# Patient Record
Sex: Female | Born: 1970 | Race: Black or African American | Hispanic: No | Marital: Married | Smoking: Never smoker
Health system: Southern US, Community
[De-identification: ages and names within clinical notes are randomized; demographics above are authoritative.]

## PROBLEM LIST (undated history)

## (undated) DIAGNOSIS — K219 Gastro-esophageal reflux disease without esophagitis: Secondary | ICD-10-CM

## (undated) DIAGNOSIS — I10 Essential (primary) hypertension: Secondary | ICD-10-CM

## (undated) DIAGNOSIS — B019 Varicella without complication: Secondary | ICD-10-CM

## (undated) HISTORY — DX: Varicella without complication: B01.9

## (undated) HISTORY — PX: CHOLECYSTECTOMY: SHX55

## (undated) HISTORY — DX: Essential (primary) hypertension: I10

## (undated) HISTORY — DX: Gastro-esophageal reflux disease without esophagitis: K21.9

---

## 1992-08-20 HISTORY — PX: TUBAL LIGATION: SHX77

## 2014-03-09 ENCOUNTER — Encounter (INDEPENDENT_AMBULATORY_CARE_PROVIDER_SITE_OTHER): Payer: Self-pay

## 2014-03-09 ENCOUNTER — Ambulatory Visit (INDEPENDENT_AMBULATORY_CARE_PROVIDER_SITE_OTHER): Payer: BC Managed Care – PPO | Admitting: Family Medicine

## 2014-03-09 ENCOUNTER — Encounter: Payer: Self-pay | Admitting: Family Medicine

## 2014-03-09 VITALS — BP 122/72 | HR 71 | Temp 97.7°F | Ht 63.25 in | Wt 254.8 lb

## 2014-03-09 DIAGNOSIS — D219 Benign neoplasm of connective and other soft tissue, unspecified: Secondary | ICD-10-CM | POA: Insufficient documentation

## 2014-03-09 DIAGNOSIS — A6000 Herpesviral infection of urogenital system, unspecified: Secondary | ICD-10-CM | POA: Insufficient documentation

## 2014-03-09 DIAGNOSIS — Z136 Encounter for screening for cardiovascular disorders: Secondary | ICD-10-CM

## 2014-03-09 DIAGNOSIS — I10 Essential (primary) hypertension: Secondary | ICD-10-CM | POA: Insufficient documentation

## 2014-03-09 DIAGNOSIS — Z1231 Encounter for screening mammogram for malignant neoplasm of breast: Secondary | ICD-10-CM

## 2014-03-09 DIAGNOSIS — D259 Leiomyoma of uterus, unspecified: Secondary | ICD-10-CM

## 2014-03-09 DIAGNOSIS — D500 Iron deficiency anemia secondary to blood loss (chronic): Secondary | ICD-10-CM | POA: Insufficient documentation

## 2014-03-09 LAB — COMPREHENSIVE METABOLIC PANEL
ALT: 10 U/L (ref 0–35)
AST: 16 U/L (ref 0–37)
Albumin: 3.6 g/dL (ref 3.5–5.2)
Alkaline Phosphatase: 50 U/L (ref 39–117)
BUN: 10 mg/dL (ref 6–23)
CO2: 25 meq/L (ref 19–32)
Calcium: 9.3 mg/dL (ref 8.4–10.5)
Chloride: 105 mEq/L (ref 96–112)
Creatinine, Ser: 0.6 mg/dL (ref 0.4–1.2)
GFR: 129.67 mL/min (ref >60.00)
GLUCOSE: 94 mg/dL (ref 70–99)
Potassium: 4.3 mEq/L (ref 3.5–5.1)
Sodium: 137 mEq/L (ref 135–145)
TOTAL PROTEIN: 7.3 g/dL (ref 6.0–8.3)
Total Bilirubin: 0.4 mg/dL (ref 0.2–1.2)

## 2014-03-09 LAB — CBC WITH DIFFERENTIAL/PLATELET
BASOS PCT: 0.4 % (ref 0.0–3.0)
Basophils Absolute: 0 10*3/uL (ref 0.0–0.1)
EOS ABS: 0.6 10*3/uL (ref 0.0–0.7)
EOS PCT: 8.8 % — AB (ref 0.0–5.0)
HCT: 31.5 % — ABNORMAL LOW (ref 36.0–46.0)
HEMOGLOBIN: 9.9 g/dL — AB (ref 12.0–15.0)
LYMPHS ABS: 1.5 10*3/uL (ref 0.7–4.0)
Lymphocytes Relative: 23 % (ref 12.0–46.0)
MCHC: 31.6 g/dL (ref 30.0–36.0)
MCV: 83.8 fl (ref 78.0–100.0)
Monocytes Absolute: 0.4 10*3/uL (ref 0.1–1.0)
Monocytes Relative: 6.4 % (ref 3.0–12.0)
Neutro Abs: 4 10*3/uL (ref 1.4–7.7)
Neutrophils Relative %: 61.4 % (ref 43.0–77.0)
Platelets: 462 10*3/uL — ABNORMAL HIGH (ref 150.0–400.0)
RBC: 3.75 Mil/uL — ABNORMAL LOW (ref 3.87–5.11)
RDW: 15.5 % (ref 11.5–15.5)
WBC: 6.5 10*3/uL (ref 4.0–10.5)

## 2014-03-09 LAB — LIPID PANEL
CHOLESTEROL: 145 mg/dL (ref 0–200)
HDL: 39.8 mg/dL (ref >39.00)
LDL Cholesterol: 93 mg/dL (ref 0–99)
NonHDL: 105.2
TRIGLYCERIDES: 63 mg/dL (ref 0.0–149.0)
Total CHOL/HDL Ratio: 4
VLDL: 12.6 mg/dL (ref 0.0–40.0)

## 2014-03-09 LAB — FERRITIN: Ferritin: 12 ng/mL (ref 10.0–291.0)

## 2014-03-09 NOTE — Assessment & Plan Note (Signed)
Well controlled without rx. Encouraged her to continue walking and lifestyle modification.

## 2014-03-09 NOTE — Assessment & Plan Note (Signed)
Continue iron. Check labs today. Orders Placed This Encounter  Procedures  . MM Digital Screening  . CBC with Differential  . Ferritin  . Comprehensive metabolic panel  . Lipid panel  . Ambulatory referral to Gynecology

## 2014-03-09 NOTE — Progress Notes (Signed)
Pre visit review using our clinic review tool, if applicable. No additional management support is needed unless otherwise documented below in the visit note. 

## 2014-03-09 NOTE — Assessment & Plan Note (Signed)
Has not had an outbreak in over 10 years. Prn Valtrex.

## 2014-03-09 NOTE — Progress Notes (Signed)
   Subjective:   Patient ID: Bailey Harmon, female    DOB: 1971/08/02, 43 y.o.   MRN: [id number]  Bailey Harmon is a pleasant 43 y.o. year old female who presents to clinic today with [place]  on 03/09/2014  HPI: HTN- has been diet controlled. Walking 4-5 times a day. Now well controlled off rx.  Fibroids- failed Lupron.  Has been followed by a clinical trial at [facility] and feels much better on current rx they have her on, including Lysteda.  She is not interested in having a hysterectomy if she can avoid this option.  Iron deficiency anemia- Due to fibroid bleeding.  Bleeding has improved. Feels much better on iron and other supplements.  Due for mammogram.  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   Takes Valtrex prn. No current outpatient prescriptions on file prior to visit.   No current facility-administered medications on file prior to visit.    No Known Allergies  Past Medical History  Diagnosis Date  . Chicken pox   . GERD (gastroesophageal reflux disease)   . Hypertension     Past Surgical History  Procedure Laterality Date  . Cholecystectomy    . Tubal ligation  1994    Family History  Problem Relation Age of Onset  . Hypertension Mother   . Cancer Father   . Heart disease Maternal Grandmother   . Hypertension Maternal Grandmother   . Diabetes Maternal Grandmother     History   Social History  . Marital Status: Married    Spouse Name: N/A    Number of Children: N/A  . Years of Education: N/A   Occupational History  . Not on file.   Social History Main Topics  . Smoking status: Never Smoker   . Smokeless tobacco: Never Used  . Alcohol Use: No  . Drug Use: No  . Sexual Activity: Yes   Other Topics Concern  . Not on file   Social History Narrative  . No narrative on file   The PMH, PSH, Social History, Family History, Medications, and allergies have been reviewed in [facility], and have been updated if relevant.   Review of  Systems    See HPI NO CP or SOB No dizziness when standing Improved fatigue  Objective:    BP 122/72  Pulse 71  Temp(Src) 97.7 F (36.5 C) (Oral)  Ht 5' 3.25" (1.607 m)  Wt 254 lb 12 oz (115.554 kg)  BMI 44.75 kg/m2  SpO2 99%  LMP 03/04/2014   Physical Exam  Nursing note and vitals reviewed. Constitutional: She appears well-developed and well-nourished. No distress.  HENT:  Head: Normocephalic.  Eyes: Pupils are equal, round, and reactive to light.  Neck: Normal range of motion. Neck supple.  Cardiovascular: Normal rate and regular rhythm.   Pulmonary/Chest: Effort normal and breath sounds normal.  Skin: Skin is warm and dry.  Psychiatric: She has a normal mood and affect. Her behavior is normal. Judgment and thought content normal.          Assessment & Plan:   Uterine leiomyoma, unspecified location - Plan: Ambulatory referral to Gynecology  Anemia due to chronic blood loss - Plan: CBC with Differential, Ferritin  Screening for ischemic heart disease - Plan: Comprehensive metabolic panel, Lipid panel No Follow-up on file.

## 2014-03-09 NOTE — Assessment & Plan Note (Signed)
Refer to GYN to consider myomectomy as she does remain symptomatic. The patient indicates understanding of these issues and agrees with the plan.

## 2014-03-09 NOTE — Patient Instructions (Addendum)
It was great to meet you. Please go to front desk and let them know you need to set up a referral or they will call if you if this is not an urgent referral.  Either MARION or LINDA will help you set it up.     We will call you with your lab results and you view them online.

## 2014-03-10 ENCOUNTER — Telehealth: Payer: Self-pay | Admitting: Family Medicine

## 2014-03-10 NOTE — Telephone Encounter (Signed)
Relevant patient education assigned to patient using Emmi. ° °

## 2014-04-14 ENCOUNTER — Ambulatory Visit (INDEPENDENT_AMBULATORY_CARE_PROVIDER_SITE_OTHER): Payer: BC Managed Care – PPO | Admitting: Obstetrics & Gynecology

## 2014-04-14 ENCOUNTER — Encounter: Payer: Self-pay | Admitting: Obstetrics & Gynecology

## 2014-04-14 VITALS — BP 147/98 | HR 74 | Ht 64.0 in | Wt 256.6 lb

## 2014-04-14 DIAGNOSIS — D251 Intramural leiomyoma of uterus: Secondary | ICD-10-CM | POA: Diagnosis not present

## 2014-04-14 NOTE — Patient Instructions (Addendum)
Abdominal Hysterectomy Abdominal hysterectomy is a surgical procedure to remove your womb (uterus). Your uterus is the muscular organ that contains a developing baby. This surgery is done for many reasons. You may need an abdominal hysterectomy if you have cancer, growths (tumors), long-term pain, or bleeding. You may also have this procedure if your uterus has slipped down into your vagina (uterine prolapse). Depending on why you need an abdominal hysterectomy, you may also have other reproductive organs removed. These could include the part of your vagina that connects with your uterus (cervix), the organs that make eggs (ovaries), and the tubes that connect the ovaries to the uterus (fallopian tubes). LET [facility] CARE PROVIDER KNOW ABOUT:   Any allergies you have.  All medicines you are taking, including vitamins, herbs, eye drops, creams, and over-the-counter medicines.  Previous problems you or members of your family have had with the use of anesthetics.  Any blood disorders you have.  Previous surgeries you have had.  Medical conditions you have. RISKS AND COMPLICATIONS Generally, this is a safe procedure. However, as with any procedure, problems can occur. Infection is the most common problem after an abdominal hysterectomy. Other possible problems include:  Bleeding.  Formation of blood clots that may break free and travel to your lungs.  Injury to other organs near your uterus.  Nerve injury causing nerve pain.  Decreased interest in sex or pain during sexual intercourse. BEFORE THE PROCEDURE  Abdominal hysterectomy is a major surgical procedure. It can affect the way you feel about yourself. Talk to your health care provider about the physical and emotional changes hysterectomy may cause.  You may need to have blood work and X-rays done before surgery.  Quit smoking if you smoke. Ask your health care provider for help if you are struggling to quit.  Stop taking  medicines that thin your blood as directed by your health care provider.  You may be instructed to take antibiotic medicines or laxatives before surgery.  Do not eat or drink anything for 6-8 hours before surgery.  Take your regular medicines with a small sip of water.  Bathe or shower the night or morning before surgery. PROCEDURE  Abdominal hysterectomy is done in the operating room at the hospital.  In most cases, you will be given a medicine that makes you go to sleep (general anesthetic).  The surgeon will make a cut (incision) through the skin in your lower belly.  The incision may be about 5-7 inches long. It may go side-to-side or up-and-down.  The surgeon will move aside the body tissue that covers your uterus. The surgeon will then carefully take out your uterus along with any of your other reproductive organs that need to be removed.  Bleeding will be controlled with clamps or sutures.  The surgeon will close your incision with sutures or metal clips. AFTER THE PROCEDURE  You will have some pain immediately after the procedure.  You will be given pain medicine in the recovery room.  You will be taken to your hospital room when you have recovered from the anesthesia.  You may need to stay in the hospital for 2-5 days.  You will be given instructions for recovery at home. Document Released: 08/11/2013 Document Reviewed: 08/11/2013 [facility] Patient Information 2015 [place], [place]. This information is not intended to replace advice given to you by your health care provider. Make sure you discuss any questions you have with your health care provider. Abdominal Hysterectomy, Care After Refer to this  sheet in the next few weeks. These instructions provide you with information on caring for yourself after your procedure. Your health care provider may also give you more specific instructions. Your treatment has been planned according to current medical practices, but problems  sometimes occur. Call your health care provider if you have any problems or questions after your procedure.  WHAT TO EXPECT AFTER THE PROCEDURE After your procedure, it is typical to have the following:  Pain.  Feeling tired.  Poor appetite.  Less interest in sex. HOME CARE INSTRUCTIONS  It takes 4-6 weeks to recover from this surgery. Make sure you follow all your health care provider's instructions. Home care instructions may include:  Take pain medicines only as directed by your health care provider. Do not take over-the-counter pain medicines without checking with your health care provider first.  Change your bandage as directed by your health care provider.  Return to your health care provider to have your sutures taken out.  Take showers instead of baths for 2-3 weeks. Ask your health care provider when it is safe to start showering.  Do not douche, use tampons, or have sexual intercourse for at least 6 weeks or until your health care provider says you can.   Follow your health care provider's advice about exercise, lifting, driving, and general activities.  Get plenty of rest and sleep.   Do not lift anything heavier than a gallon of milk (about 10 lb [4.5 kg]) for the first month after surgery.  You can resume your normal diet if your health care provider says it is okay.   Do not drink alcohol until your health care provider says you can.   If you are constipated, ask your health care provider if you can take a mild laxative.  Eating foods high in fiber may also help with constipation. Eat plenty of raw fruits and vegetables, whole grains, and beans.  Drink enough fluids to keep your urine clear or pale yellow.   Try to have someone at home with you for the first 1-2 weeks to help around the house.  Keep all follow-up appointments. SEEK MEDICAL CARE IF:   You have chills or fever.  You have swelling, redness, or pain in the area of your incision that is  getting worse.   You have pus coming from the incision.   You notice a bad smell coming from the incision or bandage.   Your incision breaks open.   You feel dizzy or light-headed.   You have pain or bleeding when you urinate.   You have persistent diarrhea.   You have persistent nausea and vomiting.   You have abnormal vaginal discharge.   You have a rash.   You have any type of abnormal reaction or develop an allergy to your medicine.   Your pain medicine is not helping.  SEEK IMMEDIATE MEDICAL CARE IF:   You have a fever and your symptoms suddenly get worse.  You have severe abdominal pain.  You have chest pain.  You have shortness of breath.  You faint.  You have pain, swelling, or redness of your leg.  You have heavy vaginal bleeding with blood clots. MAKE SURE YOU:  Understand these instructions.  Will watch your condition.  Will get help right away if you are not doing well or get worse. Document Released: 02/23/2005 Document Revised: 08/11/2013 Document Reviewed: 05/29/2013 [facility] Patient Information 2015 [place], [place]. This information is not intended to replace advice given to you  by your health care provider. Make sure you discuss any questions you have with your health care provider.

## 2014-04-14 NOTE — Progress Notes (Signed)
Subjective:     Patient ID: Bailey Harmon, female   DOB: Oct 05, 1970, 44 y.o.   MRN: [id number]  HPI 43 yo [id number]. Menses q 28 days lasting 5 days but, very heavy on days 1-3 1Pt was diagnosed with fibroids in 2005. Was recently part of an experimental drug to shrink fibroids.  She does not know the name of the med she was on.  Pt reports pain and heavy bleeding with her cycle.  She denies pain between her cycle. She does report freq urination due to fibroid pressure.   Pt reports that she has completed childbearing.  Last PAP 03/2013.  Past Medical History  Diagnosis Date  . Chicken pox   . GERD (gastroesophageal reflux disease)   . Hypertension    Past Surgical History  Procedure Laterality Date  . Cholecystectomy    . Tubal ligation  1994  c-section x 1  Current Outpatient Prescriptions on File Prior to Visit  Medication Sig Dispense Refill  . calcium-vitamin D (OSCAL WITH D) 500-200 MG-UNIT per tablet Take 1 tablet by mouth.      . cholecalciferol (VITAMIN D) 400 UNITS TABS tablet Take 400 Units by mouth.      . ferrous gluconate (FERGON) 324 MG tablet Take 324 mg by mouth daily with breakfast.      . GARCINIA CAMBOGIA-CHROMIUM PO Take by mouth. Take 4 tablets twice daily      . tranexamic acid (LYSTEDA) 650 MG TABS tablet Take 1,300 mg by mouth 3 (three) times daily.      . valACYclovir (VALTREX) 500 MG tablet Take 500 mg by mouth daily as needed.       No current facility-administered medications on file prior to visit.   No Known Allergies History   Social History  . Marital Status: Married    Spouse Name: N/A    Number of Children: N/A  . Years of Education: N/A   Occupational History  . Not on file.   Social History Main Topics  . Smoking status: Never Smoker   . Smokeless tobacco: Never Used  . Alcohol Use: No  . Drug Use: No  . Sexual Activity: Yes    Partners: Male    Birth Control/ Protection: None   Other Topics Concern  . Not on file   Social History  Narrative  . No narrative on file       Review of Systems     Objective:   Physical Exam BP 147/98  Pulse 74  Ht 5\' 4"  (1.626 m)  Wt 256 lb 9.6 oz (116.393 kg)  BMI 44.02 kg/m2  LMP 04/01/2014 Pt in NAD Abd; obese, NT, ND GU: EGBUS: no lesions Vagina: no blood in vault Cervix: no lesion; no mucopurulent d/c Uterus: enlarged.  >24weeks sized with broad base Adnexa: unable to assess due to body habitus  CBC    Component Value Date/Time   WBC 6.5 03/09/2014 1133   RBC 3.75* 03/09/2014 1133   HGB 9.9* 03/09/2014 1133   HCT 31.5* 03/09/2014 1133   PLT 462.0* 03/09/2014 1133   MCV 83.8 03/09/2014 1133   MCHC 31.6 03/09/2014 1133   RDW 15.5 03/09/2014 1133   LYMPHSABS 1.5 03/09/2014 1133   MONOABS 0.4 03/09/2014 1133   EOSABS 0.6 03/09/2014 1133   BASOSABS 0.0 03/09/2014 1133             Assessment:     Fibroids- pt wants treatment and desires a myomectomy. I have explained to pt that I  would not perform a myomectomy due to risk of complications in a pt that does not desire future fertility.  Reviewed the risks and benefits of a hyst vs myomectomy.  Pt invited to get a second opinion if she desires a myomectomy but, told that I would do her hyst if desired. I also discussed [place] with her.  She will discuss it with her husband and call back if she desires treatment.    Plan:     Need records from Dr. Jeanella Anton in [place] [facility]   Patient desires information on surgical management with TAH with bilateral salpingectomy with preservation of the ovaries..  The risks of surgery were discussed in detail with the patient including but not limited to: bleeding which may require transfusion or reoperation; infection which may require prolonged hospitalization or re-hospitalization and antibiotic therapy; injury to bowel, bladder, ureters and major vessels or other surrounding organs; need for additional procedures including laparotomy; thromboembolic phenomenon, incisional  problems and other postoperative or anesthesia complications.  Patient was told that the likelihood that her condition and symptoms will be treated effectively with this surgical management was very high; the postoperative expectations were also discussed in detail. The patient also understands the alternative treatment options which were discussed in full. All questions were answered. She reports that she will call after conversation with her husband.

## 2014-07-05 ENCOUNTER — Ambulatory Visit (INDEPENDENT_AMBULATORY_CARE_PROVIDER_SITE_OTHER): Payer: BC Managed Care – PPO | Admitting: Family Medicine

## 2014-07-05 ENCOUNTER — Encounter: Payer: Self-pay | Admitting: Family Medicine

## 2014-07-05 VITALS — BP 128/76 | HR 76 | Temp 98.0°F | Wt 264.8 lb

## 2014-07-05 DIAGNOSIS — N912 Amenorrhea, unspecified: Secondary | ICD-10-CM | POA: Insufficient documentation

## 2014-07-05 DIAGNOSIS — D259 Leiomyoma of uterus, unspecified: Secondary | ICD-10-CM

## 2014-07-05 LAB — POCT URINE PREGNANCY: Preg Test, Ur: NEGATIVE

## 2014-07-05 NOTE — Progress Notes (Signed)
   Subjective:   Patient ID: Bailey Harmon, female    DOB: 01-25-1971, 43 y.o.   MRN: [id number]  Bailey Harmon is a pleasant 43 y.o. year old female who presents to clinic today with Amenorrhea  on 07/05/2014  HPI: LMP 04/25/14- periods have always been regular. Has never skipped a period in past. H/o fibroids- saw Dr. Ihor Dow on 04/14/14- note reviewed- she suggested hysterectomy but Heninger wanted to wait it out first.  Not having any breast tenderness, abdominal pain, hot flashes or night sweats.  Current Outpatient Prescriptions on File Prior to Visit  Medication Sig Dispense Refill  . calcium-vitamin D (OSCAL WITH D) 500-200 MG-UNIT per tablet Take 1 tablet by mouth.    . cholecalciferol (VITAMIN D) 400 UNITS TABS tablet Take 400 Units by mouth.    . ferrous gluconate (FERGON) 324 MG tablet Take 324 mg by mouth daily with breakfast.    . GARCINIA CAMBOGIA-CHROMIUM PO Take by mouth. Take 4 tablets twice daily    . tranexamic acid (LYSTEDA) 650 MG TABS tablet Take 1,300 mg by mouth 3 (three) times daily.    . valACYclovir (VALTREX) 500 MG tablet Take 500 mg by mouth daily as needed.     No current facility-administered medications on file prior to visit.    No Known Allergies  Past Medical History  Diagnosis Date  . Chicken pox   . GERD (gastroesophageal reflux disease)   . Hypertension     Past Surgical History  Procedure Laterality Date  . Cholecystectomy    . Tubal ligation  1994    Family History  Problem Relation Age of Onset  . Hypertension Mother   . Cancer Father   . Heart disease Maternal Grandmother   . Hypertension Maternal Grandmother   . Diabetes Maternal Grandmother     History   Social History  . Marital Status: Married    Spouse Name: N/A    Number of Children: N/A  . Years of Education: N/A   Occupational History  . Not on file.   Social History Main Topics  . Smoking status: Never Smoker   . Smokeless tobacco: Never Used  .  Alcohol Use: No  . Drug Use: No  . Sexual Activity:    Partners: Male    Birth Control/ Protection: None   Other Topics Concern  . Not on file   Social History Narrative   The PMH, PSH, Social History, Family History, Medications, and allergies have been reviewed in [facility], and have been updated if relevant.   Review of Systems  Constitutional: Negative for fatigue.  Genitourinary: Positive for menstrual problem. Negative for vaginal bleeding, vaginal pain and pelvic pain.  Psychiatric/Behavioral: Negative for behavioral problems, sleep disturbance, dysphoric mood and agitation. The patient is not nervous/anxious.   All other systems reviewed and are negative.      Objective:    BP 128/76 mmHg  Pulse 76  Temp(Src) 98 F (36.7 C) (Oral)  Wt 264 lb 12 oz (120.09 kg)  SpO2 93%  LMP 04/25/2014   Physical Exam        Assessment & Plan:   Amenorrhea - Plan: POCT urine pregnancy  Uterine leiomyoma, unspecified location No Follow-up on file.

## 2014-07-05 NOTE — Assessment & Plan Note (Signed)
New- neg U preg. ?perimenopausal. Advised to keep symptoms journal.  Update me in a month or two. The patient indicates understanding of these issues and agrees with the plan.

## 2014-07-05 NOTE — Progress Notes (Signed)
Pre visit review using our clinic review tool, if applicable. No additional management support is needed unless otherwise documented below in the visit note. 

## 2014-07-05 NOTE — Patient Instructions (Signed)
Great to see you. You are not pregnant. Please update me next month.

## 2014-10-14 ENCOUNTER — Ambulatory Visit: Payer: Self-pay | Admitting: Bariatrics

## 2014-11-02 ENCOUNTER — Other Ambulatory Visit: Payer: Self-pay

## 2014-11-02 NOTE — Telephone Encounter (Signed)
Pt left note requesting refill valtrex to [facility] [address]. pts note said previously discussed with Dr Deborra Medina about filling rx when needed. Pt established care 03/09/14 and had sick visit on 07/05/14.no future appt scheduled.Please advise.

## 2014-11-03 MED ORDER — VALACYCLOVIR HCL 500 MG PO TABS: 500.0000 mg | ORAL_TABLET | Freq: Every day | ORAL | Status: DC | PRN

## 2015-02-10 ENCOUNTER — Other Ambulatory Visit: Payer: Self-pay

## 2015-02-10 MED ORDER — TRANEXAMIC ACID 650 MG PO TABS: 1300.0000 mg | ORAL_TABLET | Freq: Three times a day (TID) | ORAL | Status: DC

## 2015-02-10 NOTE — Telephone Encounter (Signed)
Pt left v/m requesting refill lysteda to [facility] [address] Dr Deborra Medina has not prescribed before; pt last seen 07/05/14.

## 2015-04-14 ENCOUNTER — Other Ambulatory Visit (HOSPITAL_COMMUNITY)
Admission: RE | Admit: 2015-04-14 | Discharge: 2015-04-14 | Disposition: A | Discharge status: Home / Self Care | Payer: BC Managed Care – PPO | Source: Ambulatory Visit | Attending: Family Medicine | Admitting: Family Medicine

## 2015-04-14 ENCOUNTER — Ambulatory Visit (INDEPENDENT_AMBULATORY_CARE_PROVIDER_SITE_OTHER): Payer: BC Managed Care – PPO | Admitting: Family Medicine

## 2015-04-14 ENCOUNTER — Encounter: Payer: Self-pay | Admitting: Family Medicine

## 2015-04-14 VITALS — BP 124/78 | HR 76 | Temp 98.0°F | Ht 64.0 in | Wt 236.2 lb

## 2015-04-14 DIAGNOSIS — Z01419 Encounter for gynecological examination (general) (routine) without abnormal findings: Secondary | ICD-10-CM

## 2015-04-14 DIAGNOSIS — E669 Obesity, unspecified: Secondary | ICD-10-CM | POA: Diagnosis not present

## 2015-04-14 DIAGNOSIS — Z113 Encounter for screening for infections with a predominantly sexual mode of transmission: Secondary | ICD-10-CM | POA: Diagnosis present

## 2015-04-14 DIAGNOSIS — D250 Submucous leiomyoma of uterus: Secondary | ICD-10-CM

## 2015-04-14 DIAGNOSIS — Z1151 Encounter for screening for human papillomavirus (HPV): Secondary | ICD-10-CM | POA: Insufficient documentation

## 2015-04-14 DIAGNOSIS — N760 Acute vaginitis: Secondary | ICD-10-CM | POA: Insufficient documentation

## 2015-04-14 DIAGNOSIS — Z1239 Encounter for other screening for malignant neoplasm of breast: Secondary | ICD-10-CM

## 2015-04-14 DIAGNOSIS — Z0000 Encounter for general adult medical examination without abnormal findings: Secondary | ICD-10-CM

## 2015-04-14 LAB — COMPREHENSIVE METABOLIC PANEL
ALT: 11 U/L (ref 0–35)
AST: 17 U/L (ref 0–37)
Albumin: 3.8 g/dL (ref 3.5–5.2)
Alkaline Phosphatase: 50 U/L (ref 39–117)
BILIRUBIN TOTAL: 0.3 mg/dL (ref 0.2–1.2)
BUN: 11 mg/dL (ref 6–23)
CALCIUM: 9.1 mg/dL (ref 8.4–10.5)
CO2: 24 mEq/L (ref 19–32)
Chloride: 104 mEq/L (ref 96–112)
Creatinine, Ser: 0.62 mg/dL (ref 0.40–1.20)
GFR: 133.84 mL/min (ref >60.00)
GLUCOSE: 84 mg/dL (ref 70–99)
Potassium: 4.4 mEq/L (ref 3.5–5.1)
Sodium: 134 mEq/L — ABNORMAL LOW (ref 135–145)
Total Protein: 7.5 g/dL (ref 6.0–8.3)

## 2015-04-14 LAB — CBC WITH DIFFERENTIAL/PLATELET
BASOS ABS: 0 10*3/uL (ref 0.0–0.1)
Basophils Relative: 0.5 % (ref 0.0–3.0)
EOS ABS: 0.1 10*3/uL (ref 0.0–0.7)
Eosinophils Relative: 1.9 % (ref 0.0–5.0)
Hemoglobin: 6.9 g/dL — CL (ref 12.0–15.0)
LYMPHS PCT: 21.8 % (ref 12.0–46.0)
Lymphs Abs: 1.4 10*3/uL (ref 0.7–4.0)
MCHC: 28.3 g/dL — ABNORMAL LOW (ref 30.0–36.0)
MCV: 62.6 fl — ABNORMAL LOW (ref 78.0–100.0)
MONO ABS: 0.5 10*3/uL (ref 0.1–1.0)
Monocytes Relative: 7.8 % (ref 3.0–12.0)
NEUTROS ABS: 4.3 10*3/uL (ref 1.4–7.7)
Neutrophils Relative %: 68 % (ref 43.0–77.0)
Platelets: 307 10*3/uL (ref 150.0–400.0)
RBC: 3.86 Mil/uL — ABNORMAL LOW (ref 3.87–5.11)
RDW: 19.9 % — ABNORMAL HIGH (ref 11.5–15.5)
WBC: 6.4 10*3/uL (ref 4.0–10.5)

## 2015-04-14 LAB — LIPID PANEL
CHOL/HDL RATIO: 3
Cholesterol: 144 mg/dL (ref 0–200)
HDL: 50 mg/dL (ref >39.00)
LDL Cholesterol: 85 mg/dL (ref 0–99)
NONHDL: 93.87
TRIGLYCERIDES: 46 mg/dL (ref 0.0–149.0)
VLDL: 9.2 mg/dL (ref 0.0–40.0)

## 2015-04-14 LAB — TSH: TSH: 2.11 u[IU]/mL (ref 0.35–4.50)

## 2015-04-14 NOTE — Addendum Note (Signed)
Addended by: Lucille Passy on: 04/14/2015 12:40 PM   Modules accepted: Miquel Dunn

## 2015-04-14 NOTE — Assessment & Plan Note (Signed)
Reviewed preventive care protocols, scheduled due services, and updated immunizations Discussed nutrition, exercise, diet, and healthy lifestyle.  Pap smear.  Labs today.  Mammogram ordered- pt to schedule.  Orders Placed This Encounter  Procedures  . MM Digital Screening  . CBC with Differential/Platelet  . Comprehensive metabolic panel  . Lipid panel  . TSH  . HIV antibody (with reflex)  . RPR

## 2015-04-14 NOTE — Progress Notes (Signed)
Pre visit review using our clinic review tool, if applicable. No additional management support is needed unless otherwise documented below in the visit note. 

## 2015-04-14 NOTE — Addendum Note (Signed)
Addended by: Modena Nunnery on: 04/14/2015 12:39 PM   Modules accepted: Orders

## 2015-04-14 NOTE — Patient Instructions (Signed)
Great to see you. Please schedule your mammogram.

## 2015-04-14 NOTE — Progress Notes (Addendum)
Subjective:   Patient ID: Bailey Harmon, female    DOB: 07/29/1971, 44 y.o.   MRN: [id number]  Bailey Harmon is a pleasant 44 y.o. year old female who presents to clinic today with Annual Exam  on 04/14/2015  HPI:  Doing well.  Has no complaints today. Menorrhagia has improved since she started working with a Physiological scientist and losing weight. Wt Readings from Last 3 Encounters:  04/14/15 236 lb 4 oz (107.162 kg)  07/05/14 264 lb 12 oz (120.09 kg)  04/14/14 256 lb 9.6 oz (116.393 kg)   Has not a mammogram in over 3 years.  Current Outpatient Prescriptions on File Prior to Visit  Medication Sig Dispense Refill  . calcium-vitamin D (OSCAL WITH D) 500-200 MG-UNIT per tablet Take 1 tablet by mouth.    . cholecalciferol (VITAMIN D) 400 UNITS TABS tablet Take 400 Units by mouth.    . ferrous gluconate (FERGON) 324 MG tablet Take 324 mg by mouth daily with breakfast.    . GARCINIA CAMBOGIA-CHROMIUM PO Take by mouth. Take 4 tablets twice daily    . tranexamic acid (LYSTEDA) 650 MG TABS tablet Take 2 tablets (1,300 mg total) by mouth 3 (three) times daily. 30 tablet 0  . valACYclovir (VALTREX) 500 MG tablet Take 1 tablet (500 mg total) by mouth daily as needed. 30 tablet 0   No current facility-administered medications on file prior to visit.    No Known Allergies  Past Medical History  Diagnosis Date  . Chicken pox   . GERD (gastroesophageal reflux disease)   . Hypertension     Past Surgical History  Procedure Laterality Date  . Cholecystectomy    . Tubal ligation  1994    Family History  Problem Relation Age of Onset  . Hypertension Mother   . Cancer Father   . Heart disease Maternal Grandmother   . Hypertension Maternal Grandmother   . Diabetes Maternal Grandmother     Social History   Social History  . Marital Status: Married    Spouse Name: N/A  . Number of Children: N/A  . Years of Education: N/A   Occupational History  . Not on file.   Social History  Main Topics  . Smoking status: Never Smoker   . Smokeless tobacco: Never Used  . Alcohol Use: No  . Drug Use: No  . Sexual Activity:    Partners: Male    Birth Control/ Protection: None   Other Topics Concern  . Not on file   Social History Narrative   .reivewed   Review of Systems  Constitutional: Negative.   HENT: Negative.   Respiratory: Negative.   Cardiovascular: Negative.   Gastrointestinal: Negative.   Endocrine: Negative.   Genitourinary: Negative.   Skin: Negative.   Allergic/Immunologic: Negative.   Neurological: Negative.   Hematological: Negative.   Psychiatric/Behavioral: Negative.   All other systems reviewed and are negative.      Objective:    BP 124/78 mmHg  Pulse 76  Temp(Src) 98 F (36.7 C) (Oral)  Ht 5\' 4"  (1.626 m)  Wt 236 lb 4 oz (107.162 kg)  BMI 40.53 kg/m2  SpO2 100%  LMP 03/21/2015 (Within Days)   Physical Exam   General:  Well-developed,well-nourished,in no acute distress; alert,appropriate and cooperative throughout examination Head:  normocephalic and atraumatic.   Eyes:  vision grossly intact, pupils equal, pupils round, and pupils reactive to light.   Ears:  R ear normal and L ear normal.   Nose:  no external deformity.   Mouth:  good dentition.   Neck:  No deformities, masses, or tenderness noted. Breasts:  No mass, nodules, thickening, tenderness, bulging, retraction, inflamation, nipple discharge or skin changes noted.   Lungs:  Normal respiratory effort, chest expands symmetrically. Lungs are clear to auscultation, no crackles or wheezes. Heart:  Normal rate and regular rhythm. S1 and S2 normal without gallop, murmur, click, rub or other extra sounds. Abdomen:  Bowel sounds positive,abdomen soft and non-tender without masses, organomegaly or hernias noted. Rectal:  no external abnormalities.   Genitalia:  Pelvic Exam:        External: normal female genitalia without lesions or masses        Vagina: normal without  lesions or masses        Cervix: normal without lesions or masses        Adnexa: unable to assess due to large fibroids        Uterus: enlarged, palpable fibroids        Pap smear: performed Msk:  No deformity or scoliosis noted of thoracic or lumbar spine.   Extremities:  No clubbing, cyanosis, edema, or deformity noted with normal full range of motion of all joints.   Neurologic:  alert & oriented X3 and gait normal.   Skin:  Intact without suspicious lesions or rashes Cervical Nodes:  No lymphadenopathy noted Axillary Nodes:  No palpable lymphadenopathy Psych:  Cognition and judgment appear intact. Alert and cooperative with normal attention span and concentration. No apparent delusions, illusions, hallucinations       Assessment & Plan:   Well woman exam with routine gynecological exam  Obesity  Submucous leiomyoma of uterus No Follow-up on file.

## 2015-04-14 NOTE — Assessment & Plan Note (Signed)
Congratulated on her continued success and encouraged her to continue with lifestyle changes.

## 2015-04-15 ENCOUNTER — Other Ambulatory Visit: Payer: Self-pay | Admitting: Family Medicine

## 2015-04-15 ENCOUNTER — Telehealth: Payer: Self-pay

## 2015-04-15 DIAGNOSIS — D62 Acute posthemorrhagic anemia: Secondary | ICD-10-CM

## 2015-04-15 LAB — HIV ANTIBODY (ROUTINE TESTING W REFLEX): HIV: NONREACTIVE

## 2015-04-15 LAB — RPR

## 2015-04-15 MED ORDER — FERROUS SULFATE 325 (65 FE) MG PO TABS: 325.0000 mg | ORAL_TABLET | Freq: Two times a day (BID) | ORAL | Status: DC

## 2015-04-15 NOTE — Telephone Encounter (Signed)
PLEASE NOTE: All timestamps contained within this report are represented as [place] Standard Time. CONFIDENTIALTY NOTICE: This fax transmission is intended only for the addressee. It contains information that is legally privileged, confidential or otherwise protected from use or disclosure. If you are not the intended recipient, you are strictly prohibited from reviewing, disclosing, copying using or disseminating any of this information or taking any action in reliance on or regarding this information. If you have received this fax in error, please notify us immediately by telephone so that we can arrange for its return to [place]. Phone: [phone], Toll-Free: [phone], Fax: [phone] Page: 1 of 2 Call Id: [id number] [facility] Patient Name: Bailey Harmon Gender: Female DOB: 26-Aug-1970 Age: 44 Y 62 D Return Phone Number: [phone] (Primary) Address: City/State/Zip: [place] - Client Client Site [place] Physician Deborra Medina, [place] Contact Type Call Call Type Triage / Clinical Caller Name Manuela Schwartz @ [facility] Relationship To Patient Provider Return Phone Number [phone] (Primary) Chief Complaint Lab Result (Critical) Initial Comment Manuela Schwartz - Lab @ [facility] @ [phone]- Critical Lab Nurse Assessment Nurse: Cox, RN, [facility] Date/Time Eilene Ghazi Time): 04/14/2015 5:35:35 PM Is there an on-call provider listed? ---Yes Please list name of person reporting value (Lab Employee) and a contact number. ---Manuela Schwartz [phone] Please document the following items: Lab name Lab value (read back to lab to verify) Reference range for lab value Date and time blood was drawn Collect time of birth for bilirubin results ---Hemoglobin 6.9, critical Hematocrit low- not critical 24.2 Drawn today at 1250 Ref range HgB 12-15  HcT 26-46 Specimen not hemolyzed Please collect the patient contact information from the lab. (name, phone number and address) ---Pearla Dubonnet personal-[phone] Guidelines Guideline Title Affirmed Question Affirmed Notes Nurse Date/Time ([place] Time) Disp. Time Eilene Ghazi Time) Disposition Final User 04/14/2015 5:42:36 PM Called On-Call Provider Cox, RN, [facility] 12/26/3265 1:24:58 PM Clinical Call Yes Cox, RN, [facility] After Care Instructions Given Call Event Type User Date / Time Description Paging DoctorName Phone DateTime Result/Outcome Message Type Notes Gwendolyn Grant [phone] 04/14/2015 5:42:36 PM Called On Call Provider - Reached Doctor Paged Gwendolyn Grant 04/14/2015 5:43:12 PM Spoke with On Call - General Message Result MD notified of lab value, no orders recieved PLEASE NOTE: All timestamps contained within this report are represented as [place] Standard Time. CONFIDENTIALTY NOTICE: This fax transmission is intended only for the addressee. It contains information that is legally privileged, confidential or otherwise protected from use or disclosure. If you are not the intended recipient, you are strictly prohibited from reviewing, disclosing, copying using or disseminating any of this information or taking any action in reliance on or regarding this information. If you have received this fax in error, please notify us immediately by telephone so that we can arrange for its return to [place]. Phone: [phone], Toll-Free: [phone], Fax: [phone] Page: 2 of 2 Call Id: [id number]

## 2015-04-15 NOTE — Telephone Encounter (Addendum)
Dr Deborra Medina out of office; will send note to Dr Deborra Medina and Dr Danise Mina for review.

## 2015-04-15 NOTE — Telephone Encounter (Signed)
See result note.  

## 2015-04-18 ENCOUNTER — Other Ambulatory Visit (INDEPENDENT_AMBULATORY_CARE_PROVIDER_SITE_OTHER): Payer: BC Managed Care – PPO

## 2015-04-18 DIAGNOSIS — D62 Acute posthemorrhagic anemia: Secondary | ICD-10-CM | POA: Diagnosis not present

## 2015-04-18 LAB — CBC WITH DIFFERENTIAL/PLATELET
BASOS ABS: 0.1 10*3/uL (ref 0.0–0.1)
Basophils Relative: 1 % (ref 0.0–3.0)
Eosinophils Absolute: 0.1 10*3/uL (ref 0.0–0.7)
Eosinophils Relative: 1.4 % (ref 0.0–5.0)
HCT: 24.6 % — ABNORMAL LOW (ref 36.0–46.0)
Hemoglobin: 6.9 g/dL — CL (ref 12.0–15.0)
LYMPHS ABS: 1.7 10*3/uL (ref 0.7–4.0)
Lymphocytes Relative: 22.6 % (ref 12.0–46.0)
MCHC: 28.2 g/dL — AB (ref 30.0–36.0)
MCV: 62.7 fl — AB (ref 78.0–100.0)
MONOS PCT: 5.7 % (ref 3.0–12.0)
Monocytes Absolute: 0.4 10*3/uL (ref 0.1–1.0)
NEUTROS ABS: 5.2 10*3/uL (ref 1.4–7.7)
NEUTROS PCT: 69.3 % (ref 43.0–77.0)
PLATELETS: 371 10*3/uL (ref 150.0–400.0)
RBC: 3.92 Mil/uL (ref 3.87–5.11)
RDW: 19.7 % — ABNORMAL HIGH (ref 11.5–15.5)
WBC: 7.6 10*3/uL (ref 4.0–10.5)

## 2015-04-18 LAB — CYTOLOGY - PAP

## 2015-04-19 ENCOUNTER — Other Ambulatory Visit: Payer: Self-pay | Admitting: Family Medicine

## 2015-04-19 ENCOUNTER — Encounter: Payer: Self-pay | Admitting: *Deleted

## 2015-04-19 DIAGNOSIS — D500 Iron deficiency anemia secondary to blood loss (chronic): Secondary | ICD-10-CM

## 2015-04-19 LAB — CERVICOVAGINAL ANCILLARY ONLY: Herpes: NEGATIVE

## 2015-04-19 MED ORDER — TRANEXAMIC ACID 650 MG PO TABS: 1300.0000 mg | ORAL_TABLET | Freq: Three times a day (TID) | ORAL | Status: DC

## 2015-04-19 NOTE — Addendum Note (Signed)
Addended by: Modena Nunnery on: 04/19/2015 01:57 PM   Modules accepted: Orders

## 2015-04-20 LAB — CERVICOVAGINAL ANCILLARY ONLY
BACTERIAL VAGINITIS: NEGATIVE
CANDIDA VAGINITIS: NEGATIVE

## 2015-04-26 ENCOUNTER — Telehealth: Payer: Self-pay | Admitting: *Deleted

## 2015-04-26 ENCOUNTER — Other Ambulatory Visit (INDEPENDENT_AMBULATORY_CARE_PROVIDER_SITE_OTHER): Payer: BC Managed Care – PPO

## 2015-04-26 ENCOUNTER — Observation Stay (HOSPITAL_COMMUNITY)
Admission: EM | Admit: 2015-04-26 | Discharge: 2015-04-28 | Disposition: A | Discharge status: Home / Self Care | Payer: BC Managed Care – PPO | Attending: Internal Medicine | Admitting: Internal Medicine

## 2015-04-26 ENCOUNTER — Encounter (HOSPITAL_COMMUNITY): Payer: Self-pay | Admitting: Emergency Medicine

## 2015-04-26 DIAGNOSIS — I10 Essential (primary) hypertension: Secondary | ICD-10-CM | POA: Diagnosis not present

## 2015-04-26 DIAGNOSIS — D500 Iron deficiency anemia secondary to blood loss (chronic): Secondary | ICD-10-CM | POA: Diagnosis not present

## 2015-04-26 DIAGNOSIS — D259 Leiomyoma of uterus, unspecified: Secondary | ICD-10-CM | POA: Diagnosis not present

## 2015-04-26 DIAGNOSIS — D62 Acute posthemorrhagic anemia: Principal | ICD-10-CM | POA: Insufficient documentation

## 2015-04-26 DIAGNOSIS — N920 Excessive and frequent menstruation with regular cycle: Secondary | ICD-10-CM | POA: Diagnosis not present

## 2015-04-26 DIAGNOSIS — Z9851 Tubal ligation status: Secondary | ICD-10-CM | POA: Insufficient documentation

## 2015-04-26 DIAGNOSIS — Z809 Family history of malignant neoplasm, unspecified: Secondary | ICD-10-CM | POA: Diagnosis not present

## 2015-04-26 DIAGNOSIS — Z8249 Family history of ischemic heart disease and other diseases of the circulatory system: Secondary | ICD-10-CM | POA: Diagnosis not present

## 2015-04-26 DIAGNOSIS — Z79899 Other long term (current) drug therapy: Secondary | ICD-10-CM | POA: Insufficient documentation

## 2015-04-26 DIAGNOSIS — D219 Benign neoplasm of connective and other soft tissue, unspecified: Secondary | ICD-10-CM | POA: Diagnosis present

## 2015-04-26 DIAGNOSIS — Z833 Family history of diabetes mellitus: Secondary | ICD-10-CM | POA: Insufficient documentation

## 2015-04-26 DIAGNOSIS — Z9049 Acquired absence of other specified parts of digestive tract: Secondary | ICD-10-CM | POA: Insufficient documentation

## 2015-04-26 DIAGNOSIS — K219 Gastro-esophageal reflux disease without esophagitis: Secondary | ICD-10-CM | POA: Insufficient documentation

## 2015-04-26 LAB — CBC WITH DIFFERENTIAL/PLATELET
BASOS ABS: 0.1 10*3/uL (ref 0.0–0.1)
BASOS PCT: 1 % (ref 0–1)
Band Neutrophils: 0 % (ref 0–10)
Basophils Absolute: 0.1 10*3/uL (ref 0.0–0.1)
Basophils Relative: 1.1 % (ref 0.0–3.0)
Blasts: 0 %
EOS ABS: 0.3 10*3/uL (ref 0.0–0.7)
EOS PCT: 4 % (ref 0–5)
Eosinophils Absolute: 0.2 10*3/uL (ref 0.0–0.7)
Eosinophils Relative: 2.3 % (ref 0.0–5.0)
HCT: 23.1 % — CL (ref 36.0–46.0)
HCT: 23.5 % — ABNORMAL LOW (ref 36.0–46.0)
Hemoglobin: 6.5 g/dL — CL (ref 12.0–15.0)
Hemoglobin: 6.3 g/dL — CL (ref 12.0–15.0)
LYMPHS ABS: 1.4 10*3/uL (ref 0.7–4.0)
LYMPHS ABS: 1.7 10*3/uL (ref 0.7–4.0)
LYMPHS PCT: 20 % (ref 12–46)
Lymphocytes Relative: 20 % (ref 12.0–46.0)
MCH: 17.2 pg — AB (ref 26.0–34.0)
MCHC: 27.8 g/dL — ABNORMAL LOW (ref 30.0–36.0)
MCHC: 26.8 g/dL — AB (ref 30.0–36.0)
MCV: 62.5 fl — ABNORMAL LOW (ref 78.0–100.0)
MCV: 64.2 fL — AB (ref 78.0–100.0)
MONO ABS: 0.4 10*3/uL (ref 0.1–1.0)
MONO ABS: 0.5 10*3/uL (ref 0.1–1.0)
MONOS PCT: 6 % (ref 3–12)
MYELOCYTES: 0 %
Metamyelocytes Relative: 0 %
Monocytes Relative: 6.1 % (ref 3.0–12.0)
NEUTROS ABS: 5 10*3/uL (ref 1.4–7.7)
NEUTROS ABS: 6.1 10*3/uL (ref 1.7–7.7)
NEUTROS PCT: 70.5 % (ref 43.0–77.0)
NEUTROS PCT: 69 % (ref 43–77)
NRBC: 0 /100{WBCs}
PLATELETS: 563 10*3/uL — AB (ref 150.0–400.0)
PLATELETS: 631 10*3/uL — AB (ref 150–400)
Promyelocytes Absolute: 0 %
RBC: 3.69 Mil/uL — ABNORMAL LOW (ref 3.87–5.11)
RBC: 3.66 MIL/uL — AB (ref 3.87–5.11)
RDW: 20.9 % — AB (ref 11.5–15.5)
RDW: 19.3 % — ABNORMAL HIGH (ref 11.5–15.5)
SMEAR REVIEW: INCREASED
WBC: 7.1 10*3/uL (ref 4.0–10.5)
WBC: 8.7 10*3/uL (ref 4.0–10.5)

## 2015-04-26 LAB — COMPREHENSIVE METABOLIC PANEL
ALT: 15 U/L (ref 14–54)
ANION GAP: 6 (ref 5–15)
AST: 21 U/L (ref 15–41)
Albumin: 3.2 g/dL — ABNORMAL LOW (ref 3.5–5.0)
Alkaline Phosphatase: 55 U/L (ref 38–126)
BUN: 10 mg/dL (ref 6–20)
CALCIUM: 8.8 mg/dL — AB (ref 8.9–10.3)
CHLORIDE: 105 mmol/L (ref 101–111)
CO2: 25 mmol/L (ref 22–32)
CREATININE: 0.74 mg/dL (ref 0.44–1.00)
Glucose, Bld: 101 mg/dL — ABNORMAL HIGH (ref 65–99)
Potassium: 4.2 mmol/L (ref 3.5–5.1)
SODIUM: 136 mmol/L (ref 135–145)
Total Bilirubin: 0.1 mg/dL — ABNORMAL LOW (ref 0.3–1.2)
Total Protein: 6.8 g/dL (ref 6.5–8.1)

## 2015-04-26 MED ORDER — SODIUM CHLORIDE 0.9 % IV SOLN
10.0000 mL/h | Freq: Once | INTRAVENOUS | Status: AC
  Administered 2015-04-26: 10 mL/h via INTRAVENOUS

## 2015-04-26 NOTE — ED Provider Notes (Addendum)
CSN: [id number]     Arrival date & time 04/26/15  1936 History   This chart was scribed for Bailey Fuel, MD by Forrestine Him, ED Scribe. This patient was seen in room D32C/D32C and the patient's care was started 11:09 PM.   Chief Complaint  Patient presents with  . Abnormal Lab    The patient said she went to the doctor for her check up and her MD said she had "low iron".     The history is provided by the patient. No language interpreter was used.    HPI Comments: Bailey Harmon is a 44 y.o. female with a PMHx of HTN who presents to the Emergency Department here for abnormal labs this evening. Pt went for her CPE recently in the last few weeks. At time of visit, routine labs performed showing low hemoglobin levels at 6.9. She was started on iron supplement pills 2 weeks ago which brought levels back to baseline. However, pt states levels dropped significantly low after her monthly menstrual period started. Pt was advised to come to the Emergency Department for a blood transfusion per her PCP. [name] denies any generalized weakness but admits to intermittent fatigue. No recent fever or chills. No known allergies to medications.  PCP: Arnette Norris, MD   Past Medical History  Diagnosis Date  . Chicken pox   . GERD (gastroesophageal reflux disease)   . Hypertension    Past Surgical History  Procedure Laterality Date  . Cholecystectomy    . Tubal ligation  1994   Family History  Problem Relation Age of Onset  . Hypertension Mother   . Cancer Father   . Heart disease Maternal Grandmother   . Hypertension Maternal Grandmother   . Diabetes Maternal Grandmother    Social History  Substance Use Topics  . Smoking status: Never Smoker   . Smokeless tobacco: Never Used  . Alcohol Use: No   OB History    No data available     Review of Systems  Constitutional: Positive for fatigue. Negative for fever and chills.  Respiratory: Negative for cough and shortness of breath.    Cardiovascular: Negative for chest pain.  Gastrointestinal: Negative for nausea, vomiting and abdominal pain.  Musculoskeletal: Negative for myalgias.  Neurological: Negative for dizziness, weakness, numbness and headaches.  Psychiatric/Behavioral: Negative for confusion.  All other systems reviewed and are negative.     Allergies  Review of patient's allergies indicates no known allergies.  Home Medications   Prior to Admission medications   Medication Sig Start Date End Date Taking? Authorizing Provider  calcium-vitamin D (OSCAL WITH D) 500-200 MG-UNIT per tablet Take 1 tablet by mouth.    Historical Provider, MD  cholecalciferol (VITAMIN D) 400 UNITS TABS tablet Take 400 Units by mouth.    Historical Provider, MD  ferrous gluconate (FERGON) 324 MG tablet Take 324 mg by mouth daily with breakfast.    Historical Provider, MD  ferrous sulfate 325 (65 FE) MG tablet Take 1 tablet (325 mg total) by mouth 2 (two) times daily with a meal. 04/15/15   Lucille Passy, MD  GARCINIA CAMBOGIA-CHROMIUM PO Take by mouth. Take 4 tablets twice daily    Historical Provider, MD  tranexamic acid (LYSTEDA) 650 MG TABS tablet Take 2 tablets (1,300 mg total) by mouth 3 (three) times daily. 04/19/15   Lucille Passy, MD  valACYclovir (VALTREX) 500 MG tablet Take 1 tablet (500 mg total) by mouth daily as needed. 11/03/14   Marciano Sequin  Deborra Medina, MD   Triage Vitals: BP 114/74 mmHg  Pulse 88  Temp(Src) 97.8 F (36.6 C) (Oral)  Resp 18  SpO2 100%  LMP 03/21/2015 (Within Days)   Physical Exam  Constitutional: She is oriented to person, place, and time. She appears well-developed and well-nourished.  HENT:  Head: Normocephalic and atraumatic.  Eyes: EOM are normal. Pupils are equal, round, and reactive to light.  Conjunctiva are pale   Neck: Normal range of motion. Neck supple. No JVD present.  Cardiovascular: Normal rate, regular rhythm and normal heart sounds.   No murmur heard. Pulmonary/Chest: Effort normal and  breath sounds normal. She has no wheezes. She has no rales. She exhibits no tenderness.  Abdominal: Soft. Bowel sounds are normal. She exhibits no distension and no mass. There is no tenderness.  Musculoskeletal: Normal range of motion. She exhibits no edema.  Lymphadenopathy:    She has no cervical adenopathy.  Neurological: She is alert and oriented to person, place, and time. No cranial nerve deficit. She exhibits normal muscle tone. Coordination normal.  Skin: Skin is warm and dry. No rash noted.  Psychiatric: She has a normal mood and affect. Her behavior is normal. Judgment and thought content normal.  Nursing note and vitals reviewed.   ED Course  Procedures (including critical care time)  DIAGNOSTIC STUDIES: Oxygen Saturation is 100% on RA, Normal by my interpretation.    COORDINATION OF CARE: 11:18 PM- Will order CBC and CMP. Will give fluids and arrange for admission for blood transfusion. Discussed treatment plan with pt at bedside and pt agreed to plan.     Labs Review Results for orders placed or performed during the hospital encounter of 04/26/15  CBC with Differential  Result Value Ref Range   WBC 8.7 4.0 - 10.5 K/uL   RBC 3.66 (L) 3.87 - 5.11 MIL/uL   Hemoglobin 6.3 (LL) 12.0 - 15.0 g/dL   HCT 23.5 (L) 36.0 - 46.0 %   MCV 64.2 (L) 78.0 - 100.0 fL   MCH 17.2 (L) 26.0 - 34.0 pg   MCHC 26.8 (L) 30.0 - 36.0 g/dL   RDW 19.3 (H) 11.5 - 15.5 %   Platelets 631 (H) 150 - 400 K/uL   Neutrophils Relative % 69 43 - 77 %   Lymphocytes Relative 20 12 - 46 %   Monocytes Relative 6 3 - 12 %   Eosinophils Relative 4 0 - 5 %   Basophils Relative 1 0 - 1 %   Band Neutrophils 0 0 - 10 %   Metamyelocytes Relative 0 %   Myelocytes 0 %   Promyelocytes Absolute 0 %   Blasts 0 %   nRBC 0 0 /100 WBC   Neutro Abs 6.1 1.7 - 7.7 K/uL   Lymphs Abs 1.7 0.7 - 4.0 K/uL   Monocytes Absolute 0.5 0.1 - 1.0 K/uL   Eosinophils Absolute 0.3 0.0 - 0.7 K/uL   Basophils Absolute 0.1 0.0 - 0.1  K/uL   RBC Morphology POLYCHROMASIA PRESENT    Smear Review PLATELETS APPEAR INCREASED   Comprehensive metabolic panel  Result Value Ref Range   Sodium 136 135 - 145 mmol/L   Potassium 4.2 3.5 - 5.1 mmol/L   Chloride 105 101 - 111 mmol/L   CO2 25 22 - 32 mmol/L   Glucose, Bld 101 (H) 65 - 99 mg/dL   BUN 10 6 - 20 mg/dL   Creatinine, Ser 0.74 0.44 - 1.00 mg/dL   Calcium 8.8 (L) 8.9 -  10.3 mg/dL   Total Protein 6.8 6.5 - 8.1 g/dL   Albumin 3.2 (L) 3.5 - 5.0 g/dL   AST 21 15 - 41 U/L   ALT 15 14 - 54 U/L   Alkaline Phosphatase 55 38 - 126 U/L   Total Bilirubin 0.1 (L) 0.3 - 1.2 mg/dL   GFR calc non Af Amer >60 >60 mL/min   GFR calc Af Amer >60 >60 mL/min   Anion gap 6 5 - 15  Type and screen  Result Value Ref Range   ABO/RH(D) O NEG    Antibody Screen NEG    Sample Expiration 04/29/2015    Unit Number [id number]    Blood Component Type RED CELLS,LR    Unit division 00    Status of Unit ISSUED    Transfusion Status OK TO TRANSFUSE    Crossmatch Result Compatible    Unit Number [id number]    Blood Component Type RCLI PHER 2    Unit division 00    Status of Unit ISSUED    Transfusion Status OK TO TRANSFUSE    Crossmatch Result Compatible   Prepare RBC  Result Value Ref Range   Order Confirmation ORDER PROCESSED BY BLOOD BANK   ABO/Rh  Result Value Ref Range   ABO/RH(D) O NEG    I have personally reviewed and evaluated these lab results as part of my medical decision-making.   MDM   Final diagnoses:  Anemia due to blood loss    Progressive anemia. Review of old records shows hemoglobin has dropped from 9.9 on July 21, to 6.9 on August 25 and 29, to 6.3 today. She has been on iron but hemoglobin has dropped in spite of that. She is admitted for blood transfusion. Case is discussed with Dr. Alcario Drought of [facility] who agrees to admit the patient.  I personally performed the services described in this documentation, which was scribed in my presence. The  recorded information has been reviewed and is accurate.      Bailey Fuel, MD 08/28/30 3557  Bailey Fuel, MD 32/20/25 4270

## 2015-04-26 NOTE — Telephone Encounter (Signed)
Discussed with Dr. Deborra Medina. She would like her to go to ER for transfusion.

## 2015-04-26 NOTE — ED Notes (Signed)
The patient said she went to the doctor for her check up and her MD said she had "low iron".   The patient said she is not having any symptoms other than being sleepy.  The patient does have a low Hgb at 6.5.

## 2015-04-26 NOTE — ED Notes (Signed)
Blood product consent signed; form remains at bedside

## 2015-04-26 NOTE — ED Notes (Signed)
HGB 6.3 per lab (Today at 1043 am HGB was 6.5)

## 2015-04-26 NOTE — ED Notes (Signed)
Pt ambulatory to room D32 with steady gait

## 2015-04-26 NOTE — ED Notes (Signed)
Glick, MD at bedside.  

## 2015-04-26 NOTE — Telephone Encounter (Signed)
Spoke to pt and advised per [facility]. Pt states that she recently ended menstrual cycle, but [facility].

## 2015-04-26 NOTE — Telephone Encounter (Signed)
Received call with critical labs from [facility] lab. HGB 6.5, HCT 23.10. Results forwarded to Dr Deborra Medina and Webb Silversmith, verbally given to [facility] since Dr. Deborra Medina has already left office.

## 2015-04-27 ENCOUNTER — Encounter (HOSPITAL_COMMUNITY): Payer: Self-pay | Admitting: Emergency Medicine

## 2015-04-27 ENCOUNTER — Observation Stay (HOSPITAL_COMMUNITY): Payer: BC Managed Care – PPO

## 2015-04-27 DIAGNOSIS — D250 Submucous leiomyoma of uterus: Secondary | ICD-10-CM

## 2015-04-27 DIAGNOSIS — D259 Leiomyoma of uterus, unspecified: Secondary | ICD-10-CM

## 2015-04-27 DIAGNOSIS — D500 Iron deficiency anemia secondary to blood loss (chronic): Secondary | ICD-10-CM | POA: Diagnosis not present

## 2015-04-27 DIAGNOSIS — N920 Excessive and frequent menstruation with regular cycle: Secondary | ICD-10-CM | POA: Diagnosis present

## 2015-04-27 DIAGNOSIS — N921 Excessive and frequent menstruation with irregular cycle: Secondary | ICD-10-CM

## 2015-04-27 LAB — BASIC METABOLIC PANEL
Anion gap: 6 (ref 5–15)
BUN: 11 mg/dL (ref 6–20)
CALCIUM: 8.4 mg/dL — AB (ref 8.9–10.3)
CO2: 24 mmol/L (ref 22–32)
CREATININE: 0.66 mg/dL (ref 0.44–1.00)
Chloride: 105 mmol/L (ref 101–111)
GFR calc non Af Amer: >60 mL/min (ref >60)
GLUCOSE: 89 mg/dL (ref 65–99)
Potassium: 4.7 mmol/L (ref 3.5–5.1)
Sodium: 135 mmol/L (ref 135–145)

## 2015-04-27 LAB — CBC
HEMATOCRIT: 28.2 % — AB (ref 36.0–46.0)
Hemoglobin: 8 g/dL — ABNORMAL LOW (ref 12.0–15.0)
MCH: 19.2 pg — ABNORMAL LOW (ref 26.0–34.0)
MCHC: 28.4 g/dL — AB (ref 30.0–36.0)
MCV: 67.6 fL — ABNORMAL LOW (ref 78.0–100.0)
Platelets: 536 10*3/uL — ABNORMAL HIGH (ref 150–400)
RBC: 4.17 MIL/uL (ref 3.87–5.11)
RDW: 20.9 % — AB (ref 11.5–15.5)
WBC: 7.7 10*3/uL (ref 4.0–10.5)

## 2015-04-27 LAB — PREPARE RBC (CROSSMATCH)

## 2015-04-27 LAB — ABO/RH: ABO/RH(D): O NEG

## 2015-04-27 MED ORDER — MEGESTROL ACETATE 40 MG PO TABS
80.0000 mg | ORAL_TABLET | Freq: Two times a day (BID) | ORAL | Status: DC
  Administered 2015-04-27 – 2015-04-28 (×3): 80 mg via ORAL
  Filled 2015-04-27 (×4): qty 2

## 2015-04-27 MED ORDER — FERROUS SULFATE 325 (65 FE) MG PO TABS
325.0000 mg | ORAL_TABLET | Freq: Two times a day (BID) | ORAL | Status: DC
  Administered 2015-04-27 – 2015-04-28 (×3): 325 mg via ORAL
  Filled 2015-04-27 (×3): qty 1

## 2015-04-27 MED ORDER — NAPROXEN 250 MG PO TABS
500.0000 mg | ORAL_TABLET | Freq: Three times a day (TID) | ORAL | Status: DC | PRN
  Administered 2015-04-27: 500 mg via ORAL
  Filled 2015-04-27: qty 2

## 2015-04-27 MED ORDER — SODIUM CHLORIDE 0.9 % IJ SOLN
3.0000 mL | Freq: Two times a day (BID) | INTRAMUSCULAR | Status: DC
  Administered 2015-04-27 – 2015-04-28 (×3): 3 mL via INTRAVENOUS

## 2015-04-27 MED ORDER — ESTROGENS CONJUGATED 25 MG IJ SOLR
25.0000 mg | INTRAMUSCULAR | Status: AC
  Administered 2015-04-27 (×2): 25 mg via INTRAVENOUS
  Filled 2015-04-27 (×5): qty 25

## 2015-04-27 NOTE — Progress Notes (Signed)
Patient arrived to unit via stretcher from ER.Patient alert and oriented x 4.Oriented patient to unit and call bell.No acute distress noted at present time.Will continue to monitor patient.

## 2015-04-27 NOTE — Progress Notes (Signed)
   PROGRESS NOTE  Bailey Harmon EXH:[id number] DOB: [birth year] DOA: 04/26/2015 PCP: Arnette Norris, MD  HPI/Recap of past 24 hours:  Reported feeling abdominal cramping with blood transfusion, reported history of feeling cramping during iron transfusion. Overall feel  Better.  Assessment/Plan: Principal Problem:   Anemia, blood loss Active Problems:   Fibroids   Menorrhagia  Menorrhagia/symptomatic anemia: s/p prbc transfusion, discussed with gyn oncall Dr. Rockwell Alexandria, recommended hormonal treatment /transvaginal US and outpatient follow up with Dr. Ihor Dow.   Code Status: full  Family Communication: patient   Disposition Plan: home tomorrow if hgb stable   Consultants:  Gyn over the phone  Procedures:  Transvaginal US  Antibiotics:  none   Objective: BP 126/81 mmHg  Pulse 72  Temp(Src) 98.2 F (36.8 C) (Oral)  Resp 18  Ht 5\' 4"  (1.626 m)  Wt 237 lb 10.5 oz (107.8 kg)  BMI 40.77 kg/m2  SpO2 100%  LMP 03/21/2015 (Within Days)  Intake/Output Summary (Last 24 hours) at 04/27/15 1124 Last data filed at 04/27/15 0510  Gross per 24 hour  Intake    765 ml  Output    500 ml  Net    265 ml   Filed Weights   04/27/15 0135  Weight: 237 lb 10.5 oz (107.8 kg)    Exam:   General:  NAD  Cardiovascular: RRR  Respiratory: CTABL  Abdomen: Soft/ND/NT, positive BS  Musculoskeletal: No Edema  Neuro: aaox3  Data Reviewed: Basic Metabolic Panel:  Recent Labs Lab 04/26/15 2020 04/27/15 1018  NA 136 135  K 4.2 4.7  CL 105 105  CO2 25 24  GLUCOSE 101* 89  BUN 10 11  CREATININE 0.74 0.66  CALCIUM 8.8* 8.4*   Liver Function Tests:  Recent Labs Lab 04/26/15 2020  AST 21  ALT 15  ALKPHOS 55  BILITOT 0.1*  PROT 6.8  ALBUMIN 3.2*   No results for input(s): LIPASE, AMYLASE in the last 168 hours. No results for input(s): AMMONIA in the last 168 hours. CBC:  Recent Labs Lab 04/26/15 1043 04/26/15 2020 04/27/15 1018  WBC 7.1 8.7 7.7    NEUTROABS 5.0 6.1  --   HGB 6.5 Repeated and verified X2.* 6.3* 8.0*  HCT 23.1 Repeated and verified X2.* 23.5* 28.2*  MCV 62.5* 64.2* 67.6*  PLT 563.0* 631* 536*   Cardiac Enzymes:   No results for input(s): CKTOTAL, CKMB, CKMBINDEX, TROPONINI in the last 168 hours. BNP (last 3 results) No results for input(s): BNP in the last 8760 hours.  ProBNP (last 3 results) No results for input(s): PROBNP in the last 8760 hours.  CBG: No results for input(s): GLUCAP in the last 168 hours.  No results found for this or any previous visit (from the past 240 hour(s)).   Studies: No results found.  Scheduled Meds: . conjugated estrogens  25 mg Intravenous Q4H  . ferrous sulfate  325 mg Oral BID WC  . megestrol  80 mg Oral BID  . sodium chloride  3 mL Intravenous Q12H    Continuous Infusions:    Time spent: 28mins  From 11 am to 11:30 more than 50% time spent on coordination of care.  Youssef Footman MD, PhD  [facility] Pager [phone]. If 7PM-7AM, please contact night-coverage at [email or web address], password [facility] 04/27/2015, 11:24 AM

## 2015-04-27 NOTE — H&P (Signed)
[facility] History and Physical  Bailey Harmon BSW:[id number] DOB: 01-21-1971 DOA: 04/26/2015  Referring physician: EDP PCP: Arnette Norris, MD   Chief Complaint: Anemia   HPI: Bailey Harmon is a 44 y.o. female with h/o menorrhagia due to uterine fibroids.  Patient had been having light periods due to being successfully treated with OCP up until July this summer when she ran out of her prescription.  At that point her periods once again became heavy and have been ever since.  She has not refilled her OCP since then nor seen her OB/GYN.  PCP has been following her HGB levels which have been dropping despite iron supplementation over the past couple of weeks, and so she was sent to ED for blood transfusion.  Review of Systems: Systems reviewed.  As above, otherwise negative  Past Medical History  Diagnosis Date  . Chicken pox   . GERD (gastroesophageal reflux disease)   . Hypertension    Past Surgical History  Procedure Laterality Date  . Cholecystectomy    . Tubal ligation  1994   Social History:  reports that she has never smoked. She has never used smokeless tobacco. She reports that she does not drink alcohol or use illicit drugs.  No Known Allergies  Family History  Problem Relation Age of Onset  . Hypertension Mother   . Cancer Father   . Heart disease Maternal Grandmother   . Hypertension Maternal Grandmother   . Diabetes Maternal Grandmother      Prior to Admission medications   Medication Sig Start Date End Date Taking? Authorizing Provider  Calcium Carbonate-Vitamin D (CALTRATE 600+D PO) Take 2 tablets by mouth daily. CHEWABLE   Yes Historical Provider, MD  ferrous sulfate 325 (65 FE) MG tablet Take 1 tablet (325 mg total) by mouth 2 (two) times daily with a meal. 04/15/15  Yes Lucille Passy, MD  GARCINIA CAMBOGIA-CHROMIUM PO Take 2 tablets by mouth daily.    Yes Historical Provider, MD  valACYclovir (VALTREX) 500 MG tablet Take 1 tablet (500 mg total) by mouth  daily as needed. Patient taking differently: Take 500 mg by mouth daily as needed (break outs).  11/03/14  Yes Lucille Passy, MD   Physical Exam: Filed Vitals:   04/27/15 0045  BP: 119/67  Pulse: 74  Temp:   Resp: 18    BP 119/67 mmHg  Pulse 74  Temp(Src) 97.8 F (36.6 C) (Oral)  Resp 18  SpO2 100%  LMP 03/21/2015 (Within Days)  General Appearance:    Alert, oriented, no distress, appears stated age  Head:    Normocephalic, atraumatic  Eyes:    PERRL, EOMI, sclera non-icteric        Nose:   Nares without drainage or epistaxis. Mucosa, turbinates normal  Throat:   Moist mucous membranes. Oropharynx without erythema or exudate.  Neck:   Supple. No carotid bruits.  No thyromegaly.  No lymphadenopathy.   Back:     No CVA tenderness, no spinal tenderness  Lungs:     Clear to auscultation bilaterally, without wheezes, rhonchi or rales  Chest wall:    No tenderness to palpitation  Heart:    Regular rate and rhythm without murmurs, gallops, rubs  Abdomen:     Soft, non-tender, nondistended, normal bowel sounds, no organomegaly  Genitalia:    deferred  Rectal:    deferred  Extremities:   No clubbing, cyanosis or edema.  Pulses:   2+ and symmetric all extremities  Skin:   Skin  color, texture, turgor normal, no rashes or lesions  Lymph nodes:   Cervical, supraclavicular, and axillary nodes normal  Neurologic:   CNII-XII intact. Normal strength, sensation and reflexes      throughout    Labs on Admission:  Basic Metabolic Panel:  Recent Labs Lab 04/26/15 2020  NA 136  K 4.2  CL 105  CO2 25  GLUCOSE 101*  BUN 10  CREATININE 0.74  CALCIUM 8.8*   Liver Function Tests:  Recent Labs Lab 04/26/15 2020  AST 21  ALT 15  ALKPHOS 55  BILITOT 0.1*  PROT 6.8  ALBUMIN 3.2*   No results for input(s): LIPASE, AMYLASE in the last 168 hours. No results for input(s): AMMONIA in the last 168 hours. CBC:  Recent Labs Lab 04/26/15 1043 04/26/15 2020  WBC 7.1 8.7  NEUTROABS  5.0 6.1  HGB 6.5 Repeated and verified X2.* 6.3*  HCT 23.1 Repeated and verified X2.* 23.5*  MCV 62.5* 64.2*  PLT 563.0* 631*   Cardiac Enzymes: No results for input(s): CKTOTAL, CKMB, CKMBINDEX, TROPONINI in the last 168 hours.  BNP (last 3 results) No results for input(s): PROBNP in the last 8760 hours. CBG: No results for input(s): GLUCAP in the last 168 hours.  Radiological Exams on Admission: No results found.  EKG: Independently reviewed.  Assessment/Plan Principal Problem:   Anemia, blood loss Active Problems:   Fibroids   Menorrhagia   1. Acute blood loss anemia due to menorrhagia - 1. Transfusing 2 units PRBC 2. Recheck CBC in AM 3. Need to talk with patients OB/GYN in AM about resuming the hormonal contraceptives which were working to control bleeding. 4. She is also a candidate for TAH according to her OB/GYNs prior note (04/14/14).    Code Status: Full  Family Communication: No family in room Disposition Plan: Admit to obs   Time spent: 70 min  GARDNER, JARED M. [facility] Pager [phone]  If 7AM-7PM, please contact the day team taking care of the patient [email or web address] Password [email or web address] 04/27/2015, 1:31 AM

## 2015-04-27 NOTE — Care Management Note (Signed)
Case Management Note  Patient Details  Name: Nyasiah Moffet MRN: [id number] Date of Birth: August 14, 1971  Subjective/Objective:     Patient lives with her spouse, she is independent, she has insurance, pcp and transportation at discharge, she has no problem getting her medications.  NCM will cont to follow for dc needs.                Action/Plan:   Expected Discharge Date:                  Expected Discharge Plan:  Home/Self Care  In-House Referral:     Discharge planning Services  CM Consult  Post Acute Care Choice:    Choice offered to:     DME Arranged:    DME Agency:     HH Arranged:    [place] Agency:     Status of Service:  Completed, signed off  Medicare Important Message Given:    Date Medicare IM Given:    Medicare IM give by:    Date Additional Medicare IM Given:    Additional Medicare Important Message give by:     If discussed at [place] of Stay Meetings, dates discussed:    Additional Comments:  Zenon Mayo, RN 04/27/2015, 11:12 AM

## 2015-04-28 DIAGNOSIS — N921 Excessive and frequent menstruation with irregular cycle: Secondary | ICD-10-CM | POA: Diagnosis not present

## 2015-04-28 DIAGNOSIS — D500 Iron deficiency anemia secondary to blood loss (chronic): Secondary | ICD-10-CM | POA: Diagnosis not present

## 2015-04-28 LAB — TYPE AND SCREEN
ABO/RH(D): O NEG
ANTIBODY SCREEN: NEGATIVE
UNIT DIVISION: 0
Unit division: 0

## 2015-04-28 LAB — CBC
HCT: 27.6 % — ABNORMAL LOW (ref 36.0–46.0)
HEMOGLOBIN: 7.7 g/dL — AB (ref 12.0–15.0)
MCH: 18.7 pg — ABNORMAL LOW (ref 26.0–34.0)
MCHC: 27.9 g/dL — AB (ref 30.0–36.0)
MCV: 67 fL — ABNORMAL LOW (ref 78.0–100.0)
PLATELETS: 579 10*3/uL — AB (ref 150–400)
RBC: 4.12 MIL/uL (ref 3.87–5.11)
RDW: 21.5 % — ABNORMAL HIGH (ref 11.5–15.5)
WBC: 8.6 10*3/uL (ref 4.0–10.5)

## 2015-04-28 MED ORDER — MEGESTROL ACETATE 40 MG PO TABS: 80.0000 mg | ORAL_TABLET | Freq: Two times a day (BID) | ORAL | Status: DC

## 2015-04-28 NOTE — Care Management Note (Signed)
Case Management Note  Patient Details  Name: Bailey Harmon MRN: [id number] Date of Birth: [birth year]  Subjective/Objective:     Patient is for dc today, no needs.                Action/Plan:   Expected Discharge Date:                  Expected Discharge Plan:  Home/Self Care  In-House Referral:     Discharge planning Services  CM Consult  Post Acute Care Choice:    Choice offered to:     DME Arranged:    DME Agency:     HH Arranged:    [place] Agency:     Status of Service:  Completed, signed off  Medicare Important Message Given:    Date Medicare IM Given:    Medicare IM give by:    Date Additional Medicare IM Given:    Additional Medicare Important Message give by:     If discussed at [place] of Stay Meetings, dates discussed:    Additional Comments:  Zenon Mayo, RN 04/28/2015, 10:31 AM

## 2015-04-28 NOTE — Discharge Summary (Signed)
Discharge Summary  Bailey Harmon CNO:[id number] DOB: 1970/12/19  PCP: Arnette Norris, MD  Admit date: 04/26/2015 Discharge date: 04/28/2015  Time spent: <95mins  Recommendations for Outpatient Follow-up:  1. F/u with PMD within a week for hospital discharge follow up, pmd to continue monitor cbc. 2. F/u with gyn Dr. Ihor Dow in two weeks for uterine fibroid/menorrhagia.  Discharge Diagnoses:  Active Hospital Problems   Diagnosis Date Noted  . Anemia, blood loss 04/27/2015  . Menorrhagia 04/27/2015  . Anemia due to blood loss   . Fibroids 03/09/2014    Resolved Hospital Problems   Diagnosis Date Noted Date Resolved  No resolved problems to display.    Discharge Condition: stable  Diet recommendation: heart healthy  Filed Weights   04/27/15 0135  Weight: 237 lb 10.5 oz (107.8 kg)    History of present illness:  Bailey Harmon is a 44 y.o. female with h/o menorrhagia due to uterine fibroids. Patient had been having light periods due to being successfully treated with OCP up until July this summer when she ran out of her prescription. At that point her periods once again became heavy and have been ever since. She has not refilled her OCP since then nor seen her OB/GYN.  PCP has been following her HGB levels which have been dropping despite iron supplementation over the past couple of weeks, and so she was sent to ED for blood transfusion.  Hospital Course:  Principal Problem:   Anemia, blood loss Active Problems:   Fibroids   Menorrhagia   Anemia due to blood loss  Uterine fibroids/Menorrhagia:  discussed with gyn oncall Dr. Rockwell [place], recommended hormonal treatment /transvaginal US and outpatient follow up with Dr. Ihor Dow. Patient is discharged with megace 80mg  po bid.  Symptomatic anemia: s/p rpbc transfusionx2. Post transfusion hgb appropriate. Continue iron supplement, pmd follow up to monitor cbc.  Procedures:  rpbc transfusion x2units  Transvaginal  pelvic US  Consultations:  GYN  Discharge Exam: BP 104/65 mmHg  Pulse 71  Temp(Src) 97.8 F (36.6 C) (Oral)  Resp 18  Ht 5\' 4"  (1.626 m)  Wt 237 lb 10.5 oz (107.8 kg)  BMI 40.77 kg/m2  SpO2 100%  LMP 03/21/2015 (Within Days)   General: NAD  Cardiovascular: RRR  Respiratory: CTABL  Abdomen: Soft/ND/NT, positive BS  Musculoskeletal: No Edema  Neuro: aaox3  Discharge Instructions You were cared for by a hospitalist during your hospital stay. If you have any questions about your discharge medications or the care you received while you were in the hospital after you are discharged, you can call the unit and asked to speak with the hospitalist on call if the hospitalist that took care of you is not available. Once you are discharged, your primary care physician will handle any further medical issues. Please note that NO REFILLS for any discharge medications will be authorized once you are discharged, as it is imperative that you return to your primary care physician (or establish a relationship with a primary care physician if you do not have one) for your aftercare needs so that they can reassess your need for medications and monitor your lab values.  Discharge Instructions    Diet - low sodium heart healthy    Complete by:  As directed      Increase activity slowly    Complete by:  As directed             Medication List    TAKE these medications  CALTRATE 600+D PO  Take 2 tablets by mouth daily. CHEWABLE     ferrous sulfate 325 (65 FE) MG tablet  Take 1 tablet (325 mg total) by mouth 2 (two) times daily with a meal.     GARCINIA CAMBOGIA-CHROMIUM PO  Take 2 tablets by mouth daily.     megestrol 40 MG tablet  Commonly known as:  MEGACE  Take 2 tablets (80 mg total) by mouth 2 (two) times daily.     valACYclovir 500 MG tablet  Commonly known as:  VALTREX  Take 1 tablet (500 mg total) by mouth daily as needed.       No Known Allergies       Follow-up Information    Follow up with HARRAWAY-SMITH, CAROLYN, MD In 2 weeks.   Specialty:  Obstetrics and Gynecology   Why:  menorrhagia/uterine fibroid/anemia   Contact information:   [place] [address] [phone]       Follow up with Arnette Norris, MD In 1 week.   Specialty:  Family Medicine   Why:  hospital discharge follow up   Contact information:   [place] [address] [phone]        The results of significant diagnostics from this hospitalization (including imaging, microbiology, ancillary and laboratory) are listed below for reference.    Significant Diagnostic Studies: US Transvaginal Non-ob  05-24-2015   CLINICAL DATA:  44 year old female with menorrhagia.  EXAM: TRANSABDOMINAL AND TRANSVAGINAL ULTRASOUND OF PELVIS  TECHNIQUE: Both transabdominal and transvaginal ultrasound examinations of the pelvis were performed. Transabdominal technique was performed for global imaging of the pelvis including uterus, ovaries, adnexal regions, and pelvic cul-de-sac. It was necessary to proceed with endovaginal exam following the transabdominal exam to visualize the endometrium and the ovaries K.  COMPARISON:  None  FINDINGS: Uterus  Enlarged and heterogeneous measuring 17 x 12 x 14 cm. There multiple fibroids throughout the uterus with the largest measuring 9.3 x 4.7 x 3.2 cm in the superior aspect of the uterus. Multiple smaller fibroids noted some of which may have submucosal components. Evaluation and utilization of fibroids is limited due to heterogeneous echotexture and shadowing.  Endometrium  The endometrium is somewhat distorted and not visualized.  Right ovary  Not visualized.  Left ovary  Not visualized.  Other findings  No free fluid.  IMPRESSION: Enlarged and myomatous uterus.   Electronically Signed   By: Anner Crete M.D.   On: 2015-05-24 01:01   US Pelvis Complete  2015-05-24   CLINICAL DATA:  44 year old female with  menorrhagia.  EXAM: TRANSABDOMINAL AND TRANSVAGINAL ULTRASOUND OF PELVIS  TECHNIQUE: Both transabdominal and transvaginal ultrasound examinations of the pelvis were performed. Transabdominal technique was performed for global imaging of the pelvis including uterus, ovaries, adnexal regions, and pelvic cul-de-sac. It was necessary to proceed with endovaginal exam following the transabdominal exam to visualize the endometrium and the ovaries K.  COMPARISON:  None  FINDINGS: Uterus  Enlarged and heterogeneous measuring 17 x 12 x 14 cm. There multiple fibroids throughout the uterus with the largest measuring 9.3 x 4.7 x 3.2 cm in the superior aspect of the uterus. Multiple smaller fibroids noted some of which may have submucosal components. Evaluation and utilization of fibroids is limited due to heterogeneous echotexture and shadowing.  Endometrium  The endometrium is somewhat distorted and not visualized.  Right ovary  Not visualized.  Left ovary  Not visualized.  Other findings  No free fluid.  IMPRESSION: Enlarged  and myomatous uterus.   Electronically Signed   By: Anner Crete M.D.   On: 04/28/2015 01:01    Microbiology: No results found for this or any previous visit (from the past 240 hour(s)).   Labs: Basic Metabolic Panel:  Recent Labs Lab 04/26/15 2020 04/27/15 1018  NA 136 135  K 4.2 4.7  CL 105 105  CO2 25 24  GLUCOSE 101* 89  BUN 10 11  CREATININE 0.74 0.66  CALCIUM 8.8* 8.4*   Liver Function Tests:  Recent Labs Lab 04/26/15 2020  AST 21  ALT 15  ALKPHOS 55  BILITOT 0.1*  PROT 6.8  ALBUMIN 3.2*   No results for input(s): LIPASE, AMYLASE in the last 168 hours. No results for input(s): AMMONIA in the last 168 hours. CBC:  Recent Labs Lab 04/26/15 1043 04/26/15 2020 04/27/15 1018  WBC 7.1 8.7 7.7  NEUTROABS 5.0 6.1  --   HGB 6.5 Repeated and verified X2.* 6.3* 8.0*  HCT 23.1 Repeated and verified X2.* 23.5* 28.2*  MCV 62.5* 64.2* 67.6*  PLT 563.0* 631* 536*     Cardiac Enzymes: No results for input(s): CKTOTAL, CKMB, CKMBINDEX, TROPONINI in the last 168 hours. BNP: BNP (last 3 results) No results for input(s): BNP in the last 8760 hours.  ProBNP (last 3 results) No results for input(s): PROBNP in the last 8760 hours.  CBG: No results for input(s): GLUCAP in the last 168 hours.     SignedFlorencia Reasons MD, PhD  [facility] 04/28/2015, 10:25 AM

## 2015-04-28 NOTE — Progress Notes (Signed)
NURSING PROGRESS NOTE  Bailey Harmon [id number] Discharge Data: 04/28/2015 2:47 PM Attending Provider: No att. providers found JME:QASTM Deborra Medina, MD     [name] to be D/C'd Home per MD order.  Discussed with the patient the After Visit Summary and all questions fully answered. All IV's discontinued with no bleeding noted. All belongings returned to patient for patient to take home.   Last Vital Signs:  Blood pressure 104/65, pulse 71, temperature 97.8 F (36.6 C), temperature source Oral, resp. rate 18, height 5\' 4"  (1.626 m), weight 107.8 kg (237 lb 10.5 oz), last menstrual period 03/21/2015, SpO2 100 %.  Discharge Medication List   Medication List    TAKE these medications        CALTRATE 600+D PO  Take 2 tablets by mouth daily. CHEWABLE     ferrous sulfate 325 (65 FE) MG tablet  Take 1 tablet (325 mg total) by mouth 2 (two) times daily with a meal.     GARCINIA CAMBOGIA-CHROMIUM PO  Take 2 tablets by mouth daily.     megestrol 40 MG tablet  Commonly known as:  MEGACE  Take 2 tablets (80 mg total) by mouth 2 (two) times daily.     valACYclovir 500 MG tablet  Commonly known as:  VALTREX  Take 1 tablet (500 mg total) by mouth daily as needed.         Charolette Child, RN

## 2015-04-28 NOTE — Progress Notes (Addendum)
Dr. Erlinda Hong paged to notify that hgb now 7.7 and to clarify if it is okay for patient to still discharge home. Awaiting orders.  11:37 AM MD telephone order okay to discharge.

## 2015-04-29 ENCOUNTER — Telehealth: Payer: Self-pay | Admitting: *Deleted

## 2015-04-29 NOTE — Telephone Encounter (Signed)
Transitional Care call attempted.  Left message for patient to return call. 

## 2015-05-04 NOTE — Telephone Encounter (Signed)
Transitional care call attempted.  Unable to reach patient.  TCM f/u scheduled tomorrow 05/05/15 at 1130 with Dr. Deborra Medina.

## 2015-05-05 ENCOUNTER — Encounter: Payer: Self-pay | Admitting: Family Medicine

## 2015-05-05 ENCOUNTER — Ambulatory Visit (INDEPENDENT_AMBULATORY_CARE_PROVIDER_SITE_OTHER): Payer: BC Managed Care – PPO | Admitting: Family Medicine

## 2015-05-05 VITALS — BP 122/80 | HR 74 | Temp 97.8°F | Wt 243.2 lb

## 2015-05-05 DIAGNOSIS — D500 Iron deficiency anemia secondary to blood loss (chronic): Secondary | ICD-10-CM | POA: Diagnosis not present

## 2015-05-05 DIAGNOSIS — N921 Excessive and frequent menstruation with irregular cycle: Secondary | ICD-10-CM | POA: Diagnosis not present

## 2015-05-05 LAB — COMPREHENSIVE METABOLIC PANEL
ALK PHOS: 62 U/L (ref 39–117)
ALT: 27 U/L (ref 0–35)
AST: 17 U/L (ref 0–37)
Albumin: 3.5 g/dL (ref 3.5–5.2)
BILIRUBIN TOTAL: 0.4 mg/dL (ref 0.2–1.2)
BUN: 13 mg/dL (ref 6–23)
CO2: 26 meq/L (ref 19–32)
CREATININE: 0.57 mg/dL (ref 0.40–1.20)
Calcium: 9.1 mg/dL (ref 8.4–10.5)
Chloride: 108 mEq/L (ref 96–112)
GFR: 147.44 mL/min (ref >60.00)
GLUCOSE: 78 mg/dL (ref 70–99)
Potassium: 4.5 mEq/L (ref 3.5–5.1)
SODIUM: 137 meq/L (ref 135–145)
TOTAL PROTEIN: 7.1 g/dL (ref 6.0–8.3)

## 2015-05-05 LAB — CBC WITH DIFFERENTIAL/PLATELET
BASOS ABS: 0.1 10*3/uL (ref 0.0–0.1)
Basophils Relative: 0.8 % (ref 0.0–3.0)
EOS ABS: 0.3 10*3/uL (ref 0.0–0.7)
Eosinophils Relative: 3.5 % (ref 0.0–5.0)
HCT: 29.3 % — ABNORMAL LOW (ref 36.0–46.0)
Hemoglobin: 8.7 g/dL — ABNORMAL LOW (ref 12.0–15.0)
LYMPHS ABS: 1.3 10*3/uL (ref 0.7–4.0)
Lymphocytes Relative: 17.2 % (ref 12.0–46.0)
MCHC: 29.6 g/dL — ABNORMAL LOW (ref 30.0–36.0)
MCV: 67.1 fl — AB (ref 78.0–100.0)
MONO ABS: 0.6 10*3/uL (ref 0.1–1.0)
MONOS PCT: 7.2 % (ref 3.0–12.0)
NEUTROS PCT: 71.3 % (ref 43.0–77.0)
Neutro Abs: 5.5 10*3/uL (ref 1.4–7.7)
PLATELETS: 513 10*3/uL — AB (ref 150.0–400.0)
RBC: 4.37 Mil/uL (ref 3.87–5.11)
RDW: 29.9 % — ABNORMAL HIGH (ref 11.5–15.5)
WBC: 7.7 10*3/uL (ref 4.0–10.5)

## 2015-05-05 LAB — FERRITIN: Ferritin: 8.6 ng/mL — ABNORMAL LOW (ref 10.0–291.0)

## 2015-05-05 NOTE — Progress Notes (Signed)
Pre visit review using our clinic review tool, if applicable. No additional management support is needed unless otherwise documented below in the visit note. 

## 2015-05-05 NOTE — Assessment & Plan Note (Signed)
Secondary to fibroid bleeding. Continue iron, recheck CBC today. She would still like to hold off on starting OCPs but does agree to start them if CBC has not trended up. The patient indicates understanding of these issues and agrees with the plan.

## 2015-05-05 NOTE — Progress Notes (Signed)
Subjective:   Patient ID: Bailey Harmon, female    DOB: 03-Jan-1971, 44 y.o.   MRN: [id number]  Teandra Harlan is a pleasant 44 y.o. year old female who presents to clinic today with Hospitalization Follow-up  on 05/05/2015  HPI:  Notes reviewed.  Admitted to [facility] 9/6-/98/16 after her H/H continued to drop as an outpatient despite oral iron.  Known h/o menorrhagia due to uterine fibroids.  She was advised to follow up with GYN for OCPs but did not. She does not want to take OCPs.  GYN consulted in hospital who recommended transvaginal US and discharged home on Megace 80 mg twice daily which she stopped and advised to continue oral iron.  Received 2 units PRBCs and H/H improved prior to discharged. She is not currently bleeding.  Feeling less fatigued.  Lab Results  Component Value Date   WBC 8.6 04/28/2015   HGB 7.7* 04/28/2015   HCT 27.6* 04/28/2015   MCV 67.0* 04/28/2015   PLT 579* 04/28/2015    Current Outpatient Prescriptions on File Prior to Visit  Medication Sig Dispense Refill  . Calcium Carbonate-Vitamin D (CALTRATE 600+D PO) Take 2 tablets by mouth daily. CHEWABLE    . ferrous sulfate 325 (65 FE) MG tablet Take 1 tablet (325 mg total) by mouth 2 (two) times daily with a meal. 60 tablet 3  . GARCINIA CAMBOGIA-CHROMIUM PO Take 2 tablets by mouth daily.     . valACYclovir (VALTREX) 500 MG tablet Take 1 tablet (500 mg total) by mouth daily as needed. (Patient taking differently: Take 500 mg by mouth daily as needed (break outs). ) 30 tablet 0   No current facility-administered medications on file prior to visit.    No Known Allergies  Past Medical History  Diagnosis Date  . Chicken pox   . GERD (gastroesophageal reflux disease)   . Hypertension     Past Surgical History  Procedure Laterality Date  . Cholecystectomy    . Tubal ligation  1994    Family History  Problem Relation Age of Onset  . Hypertension Mother   . Cancer Father   . Heart disease Maternal  Grandmother   . Hypertension Maternal Grandmother   . Diabetes Maternal Grandmother     Social History   Social History  . Marital Status: Married    Spouse Name: N/A  . Number of Children: N/A  . Years of Education: N/A   Occupational History  . Not on file.   Social History Main Topics  . Smoking status: Never Smoker   . Smokeless tobacco: Never Used  . Alcohol Use: No  . Drug Use: No  . Sexual Activity:    Partners: Male    Birth Control/ Protection: None   Other Topics Concern  . Not on file   Social History Narrative   The PMH, PSH, Social History, Family History, Medications, and allergies have been reviewed in [facility], and have been updated if relevant.  Review of Systems  Constitutional: Negative.   Respiratory: Negative.   Cardiovascular: Negative.   Gastrointestinal: Negative.   Genitourinary: Negative.   Musculoskeletal: Negative.  Negative for arthralgias.  Skin: Negative.   Allergic/Immunologic: Negative.   Neurological: Negative.   Hematological: Negative.   Psychiatric/Behavioral: Negative.   All other systems reviewed and are negative.      Objective:    BP 122/80 mmHg  Pulse 74  Temp(Src) 97.8 F (36.6 C) (Oral)  Wt 243 lb 4 oz (110.337 kg)  SpO2 99%  LMP 04/18/2015 Wt Readings from Last 3 Encounters:  05/05/15 243 lb 4 oz (110.337 kg)  04/27/15 237 lb 10.5 oz (107.8 kg)  04/14/15 236 lb 4 oz (107.162 kg)     Physical Exam  Constitutional: She is oriented to person, place, and time. She appears well-developed and well-nourished. No distress.  HENT:  Head: Normocephalic.  Eyes: Conjunctivae are normal.  Neck: Normal range of motion.  Cardiovascular: Normal rate, regular rhythm and normal heart sounds.   Pulmonary/Chest: Effort normal and breath sounds normal.  Abdominal: Soft.  Neurological: She is alert and oriented to person, place, and time. No cranial nerve deficit.  Skin: Skin is warm and dry.  Psychiatric: She has a normal  mood and affect. Her behavior is normal. Judgment and thought content normal.  Nursing note and vitals reviewed.         Assessment & Plan:   Anemia due to chronic blood loss  Menorrhagia with irregular cycle No Follow-up on file.

## 2015-05-05 NOTE — Patient Instructions (Signed)
Great to see you. We will call you with your results from today. 

## 2015-05-09 ENCOUNTER — Other Ambulatory Visit: Payer: Self-pay | Admitting: Family Medicine

## 2015-05-09 DIAGNOSIS — D500 Iron deficiency anemia secondary to blood loss (chronic): Secondary | ICD-10-CM

## 2015-05-13 ENCOUNTER — Encounter: Payer: Self-pay | Admitting: Family Medicine

## 2015-05-19 ENCOUNTER — Other Ambulatory Visit (INDEPENDENT_AMBULATORY_CARE_PROVIDER_SITE_OTHER): Payer: BC Managed Care – PPO

## 2015-05-19 DIAGNOSIS — D500 Iron deficiency anemia secondary to blood loss (chronic): Secondary | ICD-10-CM

## 2015-05-19 LAB — CBC WITH DIFFERENTIAL/PLATELET
BASOS ABS: 0 10*3/uL (ref 0.0–0.1)
Basophils Relative: 0.6 % (ref 0.0–3.0)
EOS ABS: 0.2 10*3/uL (ref 0.0–0.7)
EOS PCT: 2.7 % (ref 0.0–5.0)
HCT: 28.3 % — ABNORMAL LOW (ref 36.0–46.0)
LYMPHS ABS: 1.6 10*3/uL (ref 0.7–4.0)
Lymphocytes Relative: 21.5 % (ref 12.0–46.0)
MCV: 69.5 fl — ABNORMAL LOW (ref 78.0–100.0)
MONO ABS: 0.5 10*3/uL (ref 0.1–1.0)
Monocytes Relative: 6.7 % (ref 3.0–12.0)
NEUTROS PCT: 68.5 % (ref 43.0–77.0)
Neutro Abs: 5.2 10*3/uL (ref 1.4–7.7)
Platelets: 574 10*3/uL — ABNORMAL HIGH (ref 150.0–400.0)
RBC: 4.07 Mil/uL (ref 3.87–5.11)
RDW: 29.3 % — ABNORMAL HIGH (ref 11.5–15.5)
WBC: 7.6 10*3/uL (ref 4.0–10.5)

## 2016-01-31 ENCOUNTER — Other Ambulatory Visit: Payer: Self-pay | Admitting: Family Medicine

## 2016-01-31 DIAGNOSIS — Z1231 Encounter for screening mammogram for malignant neoplasm of breast: Secondary | ICD-10-CM

## 2016-02-16 ENCOUNTER — Ambulatory Visit
Admission: RE | Admit: 2016-02-16 | Discharge: 2016-02-16 | Disposition: A | Discharge status: Home / Self Care | Payer: BC Managed Care – PPO | Source: Ambulatory Visit | Attending: Family Medicine | Admitting: Family Medicine

## 2016-02-16 DIAGNOSIS — Z1231 Encounter for screening mammogram for malignant neoplasm of breast: Secondary | ICD-10-CM

## 2016-05-16 ENCOUNTER — Emergency Department (HOSPITAL_COMMUNITY)
Admission: EM | Admit: 2016-05-16 | Discharge: 2016-05-16 | Disposition: A | Discharge status: Home / Self Care | Payer: BC Managed Care – PPO | Attending: Emergency Medicine | Admitting: Emergency Medicine

## 2016-05-16 ENCOUNTER — Encounter (HOSPITAL_COMMUNITY): Payer: Self-pay | Admitting: Emergency Medicine

## 2016-05-16 DIAGNOSIS — R112 Nausea with vomiting, unspecified: Secondary | ICD-10-CM

## 2016-05-16 DIAGNOSIS — I10 Essential (primary) hypertension: Secondary | ICD-10-CM | POA: Insufficient documentation

## 2016-05-16 DIAGNOSIS — R1013 Epigastric pain: Secondary | ICD-10-CM

## 2016-05-16 DIAGNOSIS — R197 Diarrhea, unspecified: Secondary | ICD-10-CM | POA: Diagnosis not present

## 2016-05-16 LAB — CBC
HCT: 27.1 % — ABNORMAL LOW (ref 36.0–46.0)
Hemoglobin: 7 g/dL — ABNORMAL LOW (ref 12.0–15.0)
MCH: 16.8 pg — ABNORMAL LOW (ref 26.0–34.0)
MCHC: 25.8 g/dL — AB (ref 30.0–36.0)
MCV: 65 fL — ABNORMAL LOW (ref 78.0–100.0)
PLATELETS: 323 10*3/uL (ref 150–400)
RBC: 4.17 MIL/uL (ref 3.87–5.11)
RDW: 21.4 % — AB (ref 11.5–15.5)
WBC: 10.4 10*3/uL (ref 4.0–10.5)

## 2016-05-16 LAB — URINALYSIS, ROUTINE W REFLEX MICROSCOPIC
Bilirubin Urine: NEGATIVE
Glucose, UA: NEGATIVE mg/dL
Hgb urine dipstick: NEGATIVE
KETONES UR: NEGATIVE mg/dL
LEUKOCYTES UA: NEGATIVE
NITRITE: NEGATIVE
PROTEIN: 100 mg/dL — AB
Specific Gravity, Urine: 1.02 (ref 1.005–1.030)
pH: 8.5 — ABNORMAL HIGH (ref 5.0–8.0)

## 2016-05-16 LAB — URINE MICROSCOPIC-ADD ON

## 2016-05-16 LAB — COMPREHENSIVE METABOLIC PANEL
ALBUMIN: 3.5 g/dL (ref 3.5–5.0)
ALK PHOS: 56 U/L (ref 38–126)
ALT: 13 U/L — ABNORMAL LOW (ref 14–54)
AST: 19 U/L (ref 15–41)
Anion gap: 5 (ref 5–15)
BILIRUBIN TOTAL: 0.5 mg/dL (ref 0.3–1.2)
BUN: 6 mg/dL (ref 6–20)
CALCIUM: 9 mg/dL (ref 8.9–10.3)
CO2: 24 mmol/L (ref 22–32)
Chloride: 107 mmol/L (ref 101–111)
Creatinine, Ser: 0.66 mg/dL (ref 0.44–1.00)
GFR calc Af Amer: >60 mL/min (ref >60)
GFR calc non Af Amer: >60 mL/min (ref >60)
GLUCOSE: 138 mg/dL — AB (ref 65–99)
Potassium: 4 mmol/L (ref 3.5–5.1)
Sodium: 136 mmol/L (ref 135–145)
TOTAL PROTEIN: 7.3 g/dL (ref 6.5–8.1)

## 2016-05-16 LAB — POC URINE PREG, ED: PREG TEST UR: NEGATIVE

## 2016-05-16 LAB — LIPASE, BLOOD: Lipase: 27 U/L (ref 11–51)

## 2016-05-16 MED ORDER — GI COCKTAIL ~~LOC~~
30.0000 mL | Freq: Once | ORAL | Status: AC
  Administered 2016-05-16: 30 mL via ORAL
  Filled 2016-05-16: qty 30

## 2016-05-16 MED ORDER — DIPHENHYDRAMINE HCL 50 MG/ML IJ SOLN
12.5000 mg | Freq: Once | INTRAMUSCULAR | Status: AC
  Administered 2016-05-16: 12.5 mg via INTRAVENOUS
  Filled 2016-05-16: qty 1

## 2016-05-16 MED ORDER — ONDANSETRON 4 MG PO TBDP
8.0000 mg | ORAL_TABLET | Freq: Once | ORAL | Status: AC
  Administered 2016-05-16: 8 mg via ORAL

## 2016-05-16 MED ORDER — METOCLOPRAMIDE HCL 10 MG PO TABS: 10.0000 mg | ORAL_TABLET | Freq: Four times a day (QID) | ORAL | 0 refills | Status: DC

## 2016-05-16 MED ORDER — METOCLOPRAMIDE HCL 5 MG/ML IJ SOLN
10.0000 mg | Freq: Once | INTRAMUSCULAR | Status: AC
  Administered 2016-05-16: 10 mg via INTRAVENOUS
  Filled 2016-05-16: qty 2

## 2016-05-16 MED ORDER — ONDANSETRON 4 MG PO TBDP
ORAL_TABLET | ORAL | Status: AC
  Filled 2016-05-16: qty 2

## 2016-05-16 NOTE — ED Provider Notes (Signed)
[facility] DEPT Provider Note   CSN: [id number] Arrival date & time: 05/16/16  0228     History   Chief Complaint Chief Complaint  Patient presents with  . Abdominal Pain  . Nausea  . Emesis    HPI Bailey Harmon is a 45 y.o. female.  Patient presents with epigastric abdominal pain, nausea, vomiting and several loose stools since around 8:30 last night. No fever, bloody emesis or hematochezia. She ate grocery store rotisserie chicken yesterday afternoon which she feels might be contributing to her symptoms. No chest pain, SOB, dysuria or lower abdominal symptoms.    The history is provided by the patient. No language interpreter was used.    Past Medical History:  Diagnosis Date  . Chicken pox   . GERD (gastroesophageal reflux disease)   . Hypertension     Patient Active Problem List   Diagnosis Date Noted  . Anemia, blood loss 04/27/2015  . Menorrhagia 04/27/2015  . Anemia due to blood loss   . Well woman exam with routine gynecological exam 04/14/2015  . Obesity 04/14/2015  . Amenorrhea 07/05/2014  . Fibroids 03/09/2014  . Anemia due to chronic blood loss 03/09/2014  . Genital herpes 03/09/2014  . HTN (hypertension) 03/09/2014    Past Surgical History:  Procedure Laterality Date  . CHOLECYSTECTOMY    . TUBAL LIGATION  1994    OB History    No data available       Home Medications    Prior to Admission medications   Medication Sig Start Date End Date Taking? Authorizing Provider  ferrous sulfate 325 (65 FE) MG tablet Take 1 tablet (325 mg total) by mouth 2 (two) times daily with a meal. Patient not taking: Reported on 05/16/2016 04/15/15   Lucille Passy, MD  valACYclovir (VALTREX) 500 MG tablet Take 1 tablet (500 mg total) by mouth daily as needed. Patient not taking: Reported on 05/16/2016 11/03/14   Lucille Passy, MD    Family History Family History  Problem Relation Age of Onset  . Hypertension Mother   . Cancer Father   . Heart disease  Maternal Grandmother   . Hypertension Maternal Grandmother   . Diabetes Maternal Grandmother     Social History Social History  Substance Use Topics  . Smoking status: Never Smoker  . Smokeless tobacco: Never Used  . Alcohol use No     Allergies   Review of patient's allergies indicates no known allergies.   Review of Systems Review of Systems  Constitutional: Negative for chills and fever.  Respiratory: Negative.   Cardiovascular: Negative.   Gastrointestinal: Positive for abdominal pain, diarrhea, nausea and vomiting. Negative for blood in stool.  Genitourinary: Negative.  Negative for dysuria.  Musculoskeletal: Negative.  Negative for myalgias.  Neurological: Negative.      Physical Exam Updated Vital Signs BP 148/90   Pulse 83   Temp 98.1 F (36.7 C) (Oral)   Resp 18   Ht 5\' 5"  (1.651 m)   Wt 108.9 kg   LMP 02/21/2016 (Approximate)   SpO2 100%   BMI 39.94 kg/m   Physical Exam  Constitutional: She is oriented to person, place, and time. She appears well-developed and well-nourished.  HENT:  Head: Normocephalic.  Neck: Normal range of motion. Neck supple.  Cardiovascular: Normal rate and regular rhythm.   Pulmonary/Chest: Effort normal and breath sounds normal.  Abdominal: Soft. Bowel sounds are normal. There is tenderness (Epigastric tenderness with right or left upper abdominal tenderness. ). There  is no rebound and no guarding.  Musculoskeletal: Normal range of motion.  Neurological: She is alert and oriented to person, place, and time.  Skin: Skin is warm and dry. No rash noted.  Psychiatric: She has a normal mood and affect.     ED Treatments / Results  Labs (all labs ordered are listed, but only abnormal results are displayed) Labs Reviewed  COMPREHENSIVE METABOLIC PANEL - Abnormal; Notable for the following:       Result Value   Glucose, Bld 138 (*)    ALT 13 (*)    All other components within normal limits  CBC - Abnormal; Notable for the  following:    Hemoglobin 7.0 (*)    HCT 27.1 (*)    MCV 65.0 (*)    MCH 16.8 (*)    MCHC 25.8 (*)    RDW 21.4 (*)    All other components within normal limits  URINALYSIS, ROUTINE W REFLEX MICROSCOPIC (NOT AT [facility]) - Abnormal; Notable for the following:    APPearance CLOUDY (*)    pH 8.5 (*)    Protein, ur 100 (*)    All other components within normal limits  URINE MICROSCOPIC-ADD ON - Abnormal; Notable for the following:    Squamous Epithelial / LPF 6-30 (*)    Bacteria, UA MANY (*)    All other components within normal limits  LIPASE, BLOOD  POC URINE PREG, ED    EKG  EKG Interpretation None       Radiology No results found.  Procedures Procedures (including critical care time)  Medications Ordered in ED Medications  ondansetron (ZOFRAN-ODT) 4 MG disintegrating tablet (not administered)  ondansetron (ZOFRAN-ODT) disintegrating tablet 8 mg (8 mg Oral Given 05/16/16 0250)     Initial Impression / Assessment and Plan / ED Course  I have reviewed the triage vital signs and the nursing notes.  Pertinent labs & imaging results that were available during my care of the patient were reviewed by me and considered in my medical decision making (see chart for details).  Clinical Course    Patient with N, V, D and epigastric abdominal pain since early last evening, likely related to food eaten earlier in the day. No fever  Labs are essentially normal for her. History of significant anemia, today at 7.0. No tachycardia. She reports it was down to 6.0 prior to last transfusion. Baseline seems to be around 8. No acute bleeding today.   Vomiting is controlled. She is tolerating PO fluids. She is felt stable for discharge home. Encourage PCP follow up. Return precautions provided.   Final Clinical Impressions(s) / ED Diagnoses   Final diagnoses:  None   1. Epigastric pain 2. Nausea, vomiting and diarrhea  New Prescriptions New Prescriptions   No medications on file      Charlann Lange, Hershal Coria 05/16/16 [place], MD 05/16/16 [phone]

## 2016-05-16 NOTE — ED Triage Notes (Signed)
Patient with nausea and vomiting and abdominal pain.  She states that it started last night after she went to bed.  Patient states that it has progressively gotten worse in the last few hours.

## 2016-06-06 ENCOUNTER — Other Ambulatory Visit: Payer: Self-pay | Admitting: Family Medicine

## 2016-06-06 ENCOUNTER — Encounter: Payer: Self-pay | Admitting: Family Medicine

## 2016-06-07 ENCOUNTER — Ambulatory Visit (INDEPENDENT_AMBULATORY_CARE_PROVIDER_SITE_OTHER): Payer: BC Managed Care – PPO | Admitting: *Deleted

## 2016-06-07 DIAGNOSIS — Z23 Encounter for immunization: Secondary | ICD-10-CM

## 2016-06-07 MED ORDER — VALACYCLOVIR HCL 500 MG PO TABS: 500.0000 mg | ORAL_TABLET | Freq: Every day | ORAL | 0 refills | Status: DC | PRN

## 2016-06-18 ENCOUNTER — Encounter: Payer: Self-pay | Admitting: Family Medicine

## 2016-06-18 ENCOUNTER — Ambulatory Visit (INDEPENDENT_AMBULATORY_CARE_PROVIDER_SITE_OTHER): Payer: BC Managed Care – PPO | Admitting: Family Medicine

## 2016-06-18 VITALS — BP 128/72 | HR 92 | Temp 98.2°F | Wt 249.2 lb

## 2016-06-18 DIAGNOSIS — K529 Noninfective gastroenteritis and colitis, unspecified: Secondary | ICD-10-CM | POA: Diagnosis not present

## 2016-06-18 DIAGNOSIS — D500 Iron deficiency anemia secondary to blood loss (chronic): Secondary | ICD-10-CM | POA: Diagnosis not present

## 2016-06-18 MED ORDER — FERROUS SULFATE 325 (65 FE) MG PO TABS: 325.0000 mg | ORAL_TABLET | Freq: Two times a day (BID) | ORAL | 3 refills | Status: DC

## 2016-06-18 NOTE — Progress Notes (Signed)
Subjective:   Patient ID: Bailey Harmon, female    DOB: [birth year], 45 y.o.   MRN: [id number]  Bailey Harmon is a pleasant 45 y.o. year old year old female who presents to clinic today with Follow-up (iron)  on 06/18/2016  HPI:  Was seen in the ER on 05/16/16 for epigastric pain, nausea, vomiting and diarrhea.  Notes reviewed.  Felt likely viral gastroenteritis but CBC showed worsening of chronic anemia.  She has needed blood transfusions in the past. Advised to restart iron which she has not done yet.  Nausea, vomiting and diarrhea have all resolved.  She does still have heavy periods but they are more irregular and less frequent.  Lab Results  Component Value Date   WBC 10.4 05/16/2016   HGB 7.0 (L) 05/16/2016   HCT 27.1 (L) 05/16/2016   MCV 65.0 (L) 05/16/2016   PLT 323 05/16/2016     Current Outpatient Prescriptions on File Prior to Visit  Medication Sig Dispense Refill  . valACYclovir (VALTREX) 500 MG tablet Take 1 tablet (500 mg total) by mouth daily as needed. 30 tablet 0   No current facility-administered medications on file prior to visit.     No Known Allergies  Past Medical History:  Diagnosis Date  . Chicken pox   . GERD (gastroesophageal reflux disease)   . Hypertension     Past Surgical History:  Procedure Laterality Date  . CHOLECYSTECTOMY    . TUBAL LIGATION  1994    Family History  Problem Relation Age of Onset  . Hypertension Mother   . Cancer Father   . Heart disease Maternal Grandmother   . Hypertension Maternal Grandmother   . Diabetes Maternal Grandmother     Social History   Social History  . Marital status: Married    Spouse name: N/A  . Number of children: N/A  . Years of education: N/A   Occupational History  . Not on file.   Social History Main Topics  . Smoking status: Never Smoker  . Smokeless tobacco: Never Used  . Alcohol use No  . Drug use: No  . Sexual activity: Yes    Partners: Male    Birth control/ protection:  None   Other Topics Concern  . Not on file   Social History Narrative  . No narrative on file   The PMH, PSH, Social History, Family History, Medications, and allergies have been reviewed in [facility], and have been updated if relevant.  Review of Systems  Constitutional: Positive for fatigue.  Respiratory: Negative.   Cardiovascular: Negative.   Genitourinary: Positive for menstrual problem.  Neurological: Negative.   All other systems reviewed and are negative.      Objective:    BP 128/72   Pulse 92   Temp 98.2 F (36.8 C) (Oral)   Wt 249 lb 4 oz (113.1 kg)   LMP 05/28/2016   SpO2 100%   BMI 41.48 kg/m    Physical Exam  Constitutional: She is oriented to person, place, and time. She appears well-developed and well-nourished. No distress.  HENT:  Head: Normocephalic.  Eyes: Conjunctivae are normal.  Cardiovascular: Normal rate and regular rhythm.   Pulmonary/Chest: Effort normal.  Musculoskeletal: Normal range of motion.  Neurological: She is alert and oriented to person, place, and time. No cranial nerve deficit.  Skin: Skin is warm and dry. She is not diaphoretic.  Psychiatric: She has a normal mood and affect. Her behavior is normal. Judgment and thought content normal.  Nursing note  and vitals reviewed.         Assessment & Plan:   Anemia due to chronic blood loss  Gastroenteritis No Follow-up on file.

## 2016-06-18 NOTE — Assessment & Plan Note (Signed)
Resolved. Likely viral. Call or return to clinic prn if these symptoms worsen or fail to improve as anticipated.

## 2016-06-18 NOTE — Progress Notes (Signed)
Pre visit review using our clinic review tool, if applicable. No additional management support is needed unless otherwise documented below in the visit note. 

## 2016-06-18 NOTE — Patient Instructions (Signed)
Great to see you. Please start iron twice daily.  Make an appointment for your physical in 1- 2 months.

## 2016-06-18 NOTE — Assessment & Plan Note (Signed)
Deteriorated. Will not recheck CBC today as she has not restarted iron. eRx sent for ferrous sulfate twice daily and advised her to restart this right away. She will follow up with me in 1-2 months. The patient indicates understanding of these issues and agrees with the plan.

## 2016-07-04 ENCOUNTER — Telehealth: Payer: Self-pay

## 2016-07-04 NOTE — Telephone Encounter (Signed)
Crystal with [facility] , Dr Satira Sark did labs on pt; iron was 4 and TSH was 3.62. Crystal is faxing labs to Dr Deborra Medina. There were other labs that were low and will be in lab report being faxed.  Crystal is going to start process of referral to hematology and is speaking with Rosaria Ferries [facility] to refer to hematology that our office uses.

## 2016-07-05 NOTE — Telephone Encounter (Signed)
Noted, since they are placing referral to hematology, will just let Dr. Deborra Medina review labs that will be faxed. Do we know if she was started on an iron supplement?

## 2016-07-23 ENCOUNTER — Encounter: Payer: Self-pay | Admitting: Family Medicine

## 2016-07-23 ENCOUNTER — Ambulatory Visit (INDEPENDENT_AMBULATORY_CARE_PROVIDER_SITE_OTHER): Payer: BC Managed Care – PPO | Admitting: Family Medicine

## 2016-07-23 VITALS — BP 138/92 | HR 78 | Temp 98.4°F | Wt 246.6 lb

## 2016-07-23 DIAGNOSIS — R350 Frequency of micturition: Secondary | ICD-10-CM

## 2016-07-23 DIAGNOSIS — N3090 Cystitis, unspecified without hematuria: Secondary | ICD-10-CM

## 2016-07-23 LAB — POC URINALSYSI DIPSTICK (AUTOMATED)
Bilirubin, UA: NEGATIVE
Blood, UA: 2
GLUCOSE UA: NEGATIVE
Ketones, UA: NEGATIVE
NITRITE UA: POSITIVE
Protein, UA: 2
SPEC GRAV UA: 1.015
UROBILINOGEN UA: 1
pH, UA: 7

## 2016-07-23 MED ORDER — SULFAMETHOXAZOLE-TRIMETHOPRIM 800-160 MG PO TABS: 1.0000 | ORAL_TABLET | Freq: Two times a day (BID) | ORAL | 0 refills | Status: DC

## 2016-07-23 NOTE — Patient Instructions (Signed)
Please take antibiotic as directed and follow up with your physician if symptoms do not improve, worsen, or you develop a fever >100, or flank pain.    Urinary Tract Infection, Adult Introduction A urinary tract infection (UTI) is an infection of any part of the urinary tract. The urinary tract includes the:  Kidneys.  Ureters.  Bladder.  Urethra. These organs make, store, and get rid of pee (urine) in the body. Follow these instructions at home:  Take over-the-counter and prescription medicines only as told by your doctor.  If you were prescribed an antibiotic medicine, take it as told by your doctor. Do not stop taking the antibiotic even if you start to feel better.  Avoid the following drinks:  Alcohol.  Caffeine.  Tea.  Carbonated drinks.  Drink enough fluid to keep your pee clear or pale yellow.  Keep all follow-up visits as told by your doctor. This is important.  Make sure to:  Empty your bladder often and completely. Do not to hold pee for long periods of time.  Empty your bladder before and after sex.  Wipe from front to back after a bowel movement if you are female. Use each tissue one time when you wipe. Contact a doctor if:  You have back pain.  You have a fever.  You feel sick to your stomach (nauseous).  You throw up (vomit).  Your symptoms do not get better after 3 days.  Your symptoms go away and then come back. Get help right away if:  You have very bad back pain.  You have very bad lower belly (abdominal) pain.  You are throwing up and cannot keep down any medicines or water. This information is not intended to replace advice given to you by your health care provider. Make sure you discuss any questions you have with your health care provider. Document Released: 01/23/2008 Document Revised: 01/12/2016 Document Reviewed: 06/27/2015  2017 Elsevier

## 2016-07-23 NOTE — Progress Notes (Signed)
Pre visit review using our clinic review tool, if applicable. No additional management support is needed unless otherwise documented below in the visit note. 

## 2016-07-23 NOTE — Progress Notes (Signed)
Subjective:    Patient ID: [name], female    DOB: [birth year], 45 y.o.   MRN: [id number]  HPI  Ms. Glidden is a 45 year old female who presents today with frequent urination that started 3 days ago.  Associated symptoms of burning with urination and urgency.  Denies fever, chills, sweats, N/V/D, vaginal discharge,or flank pain.  Treatment at home relates to increased water intake and cranberry juice that has provided limited benefit. No history of frequent UTIs. No recent antibiotic use. No aggravating or alleviating factors noted.  Review of Systems  Constitutional: Negative for chills, fatigue and fever.  Respiratory: Negative for cough, shortness of breath and wheezing.   Cardiovascular: Negative for chest pain and palpitations.  Gastrointestinal: Negative for abdominal pain, diarrhea, nausea and vomiting.  Genitourinary: Positive for dysuria, frequency and urgency. Negative for vaginal discharge.  Musculoskeletal: Negative for myalgias.  Skin: Negative for rash.   Past Medical History:  Diagnosis Date  . Chicken pox   . GERD (gastroesophageal reflux disease)   . Hypertension      Social History   Social History  . Marital status: Married    Spouse name: N/A  . Number of children: N/A  . Years of education: N/A   Occupational History  . Not on file.   Social History Main Topics  . Smoking status: Never Smoker  . Smokeless tobacco: Never Used  . Alcohol use No  . Drug use: No  . Sexual activity: Yes    Partners: Male    Birth control/ protection: None   Other Topics Concern  . Not on file   Social History Narrative  . No narrative on file    Past Surgical History:  Procedure Laterality Date  . CHOLECYSTECTOMY    . TUBAL LIGATION  1994    Family History  Problem Relation Age of Onset  . Hypertension Mother   . Cancer Father   . Heart disease Maternal Grandmother   . Hypertension Maternal Grandmother   . Diabetes Maternal Grandmother     No  Known Allergies  Current Outpatient Prescriptions on File Prior to Visit  Medication Sig Dispense Refill  . ferrous sulfate 325 (65 FE) MG tablet Take 1 tablet (325 mg total) by mouth 2 (two) times daily with a meal. 60 tablet 3  . valACYclovir (VALTREX) 500 MG tablet Take 1 tablet (500 mg total) by mouth daily as needed. 30 tablet 0   No current facility-administered medications on file prior to visit.     BP (!) 138/92 (BP Location: Left Arm, Patient Position: Sitting, Cuff Size: Large)   Pulse 78   Temp 98.4 F (36.9 C) (Oral)   Wt 246 lb 9.6 oz (111.9 kg)   LMP 06/26/2016 (Exact Date)   SpO2 99%   BMI 41.04 kg/m       Objective:   Physical Exam  Constitutional: She is oriented to person, place, and time. She appears well-developed and well-nourished.  Eyes: Pupils are equal, round, and reactive to light.  Neck: Neck supple.  Cardiovascular: Normal rate and regular rhythm.   Pulmonary/Chest: Effort normal and breath sounds normal. She has no wheezes. She has no rales.  Abdominal: Soft. Bowel sounds are normal.  Mild suprapubic tenderness present  Lymphadenopathy:    She has no cervical adenopathy.  Neurological: She is alert and oriented to person, place, and time.  Skin: Skin is warm and dry.       Assessment & Plan:   1.  Cystitis UA 3+ Leukocytes and + nitrites: Will treat with Bactrim; Will send for culture. Exam is reassuring and not concerning for pyelonephritis at this time. Advised patient to increase fluids, and follow up if symptoms do not improve in 2 to 3 days, worsen, or she develops fever, chills, or flank pain. Also advised her to avoid unprotected intercourse with this medication. Advised her that this medication is not recommended with pregnancy. - sulfamethoxazole-trimethoprim (BACTRIM DS,SEPTRA DS) 800-160 MG tablet; Take 1 tablet by mouth 2 (two) times daily.  Dispense: 14 tablet; Refill: 0 - Urine Culture  2. Frequency of urination  - POCT  Urinalysis Dipstick (Automated)   Delano Metz, FNP-C

## 2016-07-26 LAB — URINE CULTURE

## 2016-07-30 ENCOUNTER — Other Ambulatory Visit: Payer: Self-pay | Admitting: Family Medicine

## 2016-07-30 DIAGNOSIS — Z01419 Encounter for gynecological examination (general) (routine) without abnormal findings: Secondary | ICD-10-CM

## 2016-07-30 DIAGNOSIS — D500 Iron deficiency anemia secondary to blood loss (chronic): Secondary | ICD-10-CM

## 2016-08-06 ENCOUNTER — Other Ambulatory Visit (INDEPENDENT_AMBULATORY_CARE_PROVIDER_SITE_OTHER): Payer: BC Managed Care – PPO

## 2016-08-06 DIAGNOSIS — Z01419 Encounter for gynecological examination (general) (routine) without abnormal findings: Secondary | ICD-10-CM | POA: Diagnosis not present

## 2016-08-07 ENCOUNTER — Encounter: Payer: Self-pay | Admitting: Family Medicine

## 2016-08-07 ENCOUNTER — Ambulatory Visit (INDEPENDENT_AMBULATORY_CARE_PROVIDER_SITE_OTHER): Payer: BC Managed Care – PPO | Admitting: Family Medicine

## 2016-08-07 ENCOUNTER — Telehealth: Payer: Self-pay | Admitting: Radiology

## 2016-08-07 VITALS — BP 132/78 | HR 96 | Temp 98.2°F | Ht 63.75 in | Wt 253.5 lb

## 2016-08-07 DIAGNOSIS — D500 Iron deficiency anemia secondary to blood loss (chronic): Secondary | ICD-10-CM

## 2016-08-07 DIAGNOSIS — I10 Essential (primary) hypertension: Secondary | ICD-10-CM

## 2016-08-07 DIAGNOSIS — D259 Leiomyoma of uterus, unspecified: Secondary | ICD-10-CM | POA: Diagnosis not present

## 2016-08-07 DIAGNOSIS — Z01419 Encounter for gynecological examination (general) (routine) without abnormal findings: Secondary | ICD-10-CM | POA: Diagnosis not present

## 2016-08-07 DIAGNOSIS — N938 Other specified abnormal uterine and vaginal bleeding: Secondary | ICD-10-CM

## 2016-08-07 DIAGNOSIS — E6609 Other obesity due to excess calories: Secondary | ICD-10-CM

## 2016-08-07 LAB — CBC WITH DIFFERENTIAL/PLATELET
BASOS PCT: 0.8 % (ref 0.0–3.0)
Basophils Absolute: 0.1 10*3/uL (ref 0.0–0.1)
EOS PCT: 2.7 % (ref 0.0–5.0)
Eosinophils Absolute: 0.2 10*3/uL (ref 0.0–0.7)
HCT: 23.6 % — CL (ref 36.0–46.0)
Lymphocytes Relative: 14.8 % (ref 12.0–46.0)
Lymphs Abs: 1.3 10*3/uL (ref 0.7–4.0)
MONO ABS: 0.6 10*3/uL (ref 0.1–1.0)
MONOS PCT: 6.8 % (ref 3.0–12.0)
Neutro Abs: 6.3 10*3/uL (ref 1.4–7.7)
Neutrophils Relative %: 74.9 % (ref 43.0–77.0)
Platelets: 569 10*3/uL — ABNORMAL HIGH (ref 150.0–400.0)
RBC: 3.7 Mil/uL — AB (ref 3.87–5.11)
RDW: 25.1 % — AB (ref 11.5–15.5)
WBC: 8.4 10*3/uL (ref 4.0–10.5)

## 2016-08-07 LAB — LIPID PANEL
CHOLESTEROL: 138 mg/dL (ref 0–200)
HDL: 46.4 mg/dL (ref >39.00)
LDL CALC: 76 mg/dL (ref 0–99)
NonHDL: 91.72
TRIGLYCERIDES: 78 mg/dL (ref 0.0–149.0)
Total CHOL/HDL Ratio: 3
VLDL: 15.6 mg/dL (ref 0.0–40.0)

## 2016-08-07 LAB — COMPREHENSIVE METABOLIC PANEL
ALBUMIN: 3.6 g/dL (ref 3.5–5.2)
ALK PHOS: 52 U/L (ref 39–117)
ALT: 20 U/L (ref 0–35)
AST: 26 U/L (ref 0–37)
BUN: 9 mg/dL (ref 6–23)
CALCIUM: 9.3 mg/dL (ref 8.4–10.5)
CHLORIDE: 104 meq/L (ref 96–112)
CO2: 26 mEq/L (ref 19–32)
Creatinine, Ser: 0.6 mg/dL (ref 0.40–1.20)
GFR: 138.19 mL/min (ref >60.00)
Glucose, Bld: 84 mg/dL (ref 70–99)
POTASSIUM: 4.5 meq/L (ref 3.5–5.1)
SODIUM: 135 meq/L (ref 135–145)
TOTAL PROTEIN: 6.6 g/dL (ref 6.0–8.3)
Total Bilirubin: 0.2 mg/dL (ref 0.2–1.2)

## 2016-08-07 LAB — TSH: TSH: 2.73 u[IU]/mL (ref 0.35–4.50)

## 2016-08-07 NOTE — Assessment & Plan Note (Signed)
Reviewed preventive care protocols, scheduled due services, and updated immunizations Discussed nutrition, exercise, diet, and healthy lifestyle.  

## 2016-08-07 NOTE — Telephone Encounter (Signed)
Aware of low H/H.  Has appt with her hematologist tomorrow.  Thank you.

## 2016-08-07 NOTE — Telephone Encounter (Signed)
[facility] called a critical HGB - 6.9. Results given to Dr Deborra Medina

## 2016-08-07 NOTE — Progress Notes (Signed)
Subjective:   Patient ID: Bailey Harmon, female    DOB: 1971-06-01, 45 y.o.   MRN: [id number]  Simrun Laroque is a pleasant 45 y.o. year old female who presents to clinic today with Annual Exam  on 08/07/2016  HPI:  Last pap smear done by me on 04/14/15  Mammogram 02/16/16  H/o anemia due to blood loss from fibroids- has been reluctant to see GYN for treatment options. Currently taking iron 325 mg twice daily.  Has been tired.  Has been having heavy bleeding.  Going to hematologist tomorrow.   Lab Results  Component Value Date   WBC 10.4 05/16/2016   HGB 7.0 (L) 05/16/2016   HCT 27.1 (L) 05/16/2016   MCV 65.0 (L) 05/16/2016   PLT 323 05/16/2016   Obesity- undergoing process to have bariatric surgery for gastric sleeve.  Lab Results  Component Value Date   NA 136 05/16/2016   K 4.0 05/16/2016   CL 107 05/16/2016   CO2 24 05/16/2016   Lab Results  Component Value Date   ALT 13 (L) 05/16/2016   AST 19 05/16/2016   ALKPHOS 56 05/16/2016   BILITOT 0.5 05/16/2016   Lab Results  Component Value Date   CHOL 144 04/14/2015   HDL 50.00 04/14/2015   LDLCALC 85 04/14/2015   TRIG 46.0 04/14/2015   CHOLHDL 3 04/14/2015   Lab Results  Component Value Date   TSH 2.11 04/14/2015   Current Outpatient Prescriptions on File Prior to Visit  Medication Sig Dispense Refill  . cholecalciferol (VITAMIN D) 1000 units tablet Take 1,000 Units by mouth daily.    . ferrous sulfate 325 (65 FE) MG tablet Take 1 tablet (325 mg total) by mouth 2 (two) times daily with a meal. 60 tablet 3  . valACYclovir (VALTREX) 500 MG tablet Take 1 tablet (500 mg total) by mouth daily as needed. 30 tablet 0  . vitamin A 10000 UNIT capsule Take 10,000 Units by mouth daily.     No current facility-administered medications on file prior to visit.     No Known Allergies  Past Medical History:  Diagnosis Date  . Chicken pox   . GERD (gastroesophageal reflux disease)   . Hypertension     Past  Surgical History:  Procedure Laterality Date  . CHOLECYSTECTOMY    . TUBAL LIGATION  1994    Family History  Problem Relation Age of Onset  . Hypertension Mother   . Cancer Father   . Heart disease Maternal Grandmother   . Hypertension Maternal Grandmother   . Diabetes Maternal Grandmother     Social History   Social History  . Marital status: Married    Spouse name: N/A  . Number of children: N/A  . Years of education: N/A   Occupational History  . Not on file.   Social History Main Topics  . Smoking status: Never Smoker  . Smokeless tobacco: Never Used  . Alcohol use No  . Drug use: No  . Sexual activity: Yes    Partners: Male    Birth control/ protection: None   Other Topics Concern  . Not on file   Social History Narrative  . No narrative on file   The PMH, PSH, Social History, Family History, Medications, and allergies have been reviewed in [facility], and have been updated if relevant.  Review of Systems  Constitutional: Positive for fatigue.  HENT: Negative.   Eyes: Negative.   Respiratory: Negative.   Cardiovascular: Negative.   Gastrointestinal:  Negative.   Endocrine: Negative.   Genitourinary: Negative.   Musculoskeletal: Negative.   Allergic/Immunologic: Negative.   Neurological: Negative.   Hematological: Negative.   Psychiatric/Behavioral: Negative.   All other systems reviewed and are negative.      Objective:    BP 132/78   Pulse 96   Temp 98.2 F (36.8 C) (Oral)   Ht 5' 3.75" (1.619 m)   Wt 253 lb 8 oz (115 kg)   LMP 07/27/2016   SpO2 98%   BMI 43.86 kg/m    Physical Exam   General:  Well-developed,well-nourished,in no acute distress; alert,appropriate and cooperative throughout examination Head:  normocephalic and atraumatic.   Eyes:  vision grossly intact, PERRL Ears:  R ear normal and L ear normal externally, TMs clear bilaterally Nose:  no external deformity.   Mouth:  good dentition.   Neck:  No deformities, masses, or  tenderness noted. Breasts:  No mass, nodules, thickening, tenderness, bulging, retraction, inflamation, nipple discharge or skin changes noted.   Lungs:  Normal respiratory effort, chest expands symmetrically. Lungs are clear to auscultation, no crackles or wheezes. Heart:  Normal rate and regular rhythm. S1 and S2 normal without gallop, murmur, click, rub or other extra sounds. Abdomen:  Bowel sounds positive,abdomen soft and non-tender without masses, organomegaly or hernias noted. Msk:  No deformity or scoliosis noted of thoracic or lumbar spine.   Extremities:  No clubbing, cyanosis, edema, or deformity noted with normal full range of motion of all joints.   Neurologic:  alert & oriented X3 and gait normal.   Skin:  Intact without suspicious lesions or rashes Cervical Nodes:  No lymphadenopathy noted Axillary Nodes:  No palpable lymphadenopathy Psych:  Cognition and judgment appear intact. Alert and cooperative with normal attention span and concentration. No apparent delusions, illusions, hallucinations       Assessment & Plan:   Well woman exam  Anemia, blood loss  Anemia due to chronic blood loss  Essential hypertension  Uterine leiomyoma, unspecified location - Plan: Ambulatory referral to Gynecology  DUB (dysfunctional uterine bleeding) - Plan: Ambulatory referral to Gynecology No Follow-up on file.

## 2016-08-07 NOTE — Assessment & Plan Note (Signed)
Planning to undergo gastric sleeve.

## 2016-08-07 NOTE — Patient Instructions (Addendum)
Great to see you. Happy Holidays. Please stop by to see Marion on your way out. 

## 2016-08-07 NOTE — Assessment & Plan Note (Signed)
With heavy bleeding, leading to chronic anemia. Agrees to gyn referral today to discuss tx options.

## 2016-09-17 ENCOUNTER — Other Ambulatory Visit: Payer: Self-pay | Admitting: Family Medicine

## 2017-03-14 ENCOUNTER — Other Ambulatory Visit: Payer: Self-pay | Admitting: Family Medicine

## 2017-03-14 DIAGNOSIS — Z1231 Encounter for screening mammogram for malignant neoplasm of breast: Secondary | ICD-10-CM

## 2017-03-19 ENCOUNTER — Ambulatory Visit
Admission: RE | Admit: 2017-03-19 | Discharge: 2017-03-19 | Disposition: A | Discharge status: Home / Self Care | Payer: BC Managed Care – PPO | Source: Ambulatory Visit | Attending: Family Medicine | Admitting: Family Medicine

## 2017-03-19 ENCOUNTER — Ambulatory Visit: Payer: BC Managed Care – PPO

## 2017-03-19 DIAGNOSIS — Z1231 Encounter for screening mammogram for malignant neoplasm of breast: Secondary | ICD-10-CM

## 2017-10-10 LAB — TSH: TSH: 2.76 (ref 0.41–5.90)

## 2017-10-10 LAB — BASIC METABOLIC PANEL
BUN: 15 (ref 4–21)
CREATININE: 0.6 (ref 0.5–1.1)
Glucose: 82
Potassium: 4.2 (ref 3.4–5.3)
Sodium: 139 (ref 137–147)

## 2017-10-10 LAB — LIPID PANEL
Cholesterol: 161 (ref 0–200)
HDL: 63 (ref 35–70)
LDL CALC: 88
LDL/HDL RATIO: 2.6
Triglycerides: 48 (ref 40–160)

## 2017-10-10 LAB — CBC AND DIFFERENTIAL
HCT: 35 — AB (ref 36–46)
Hemoglobin: 11 — AB (ref 12.0–16.0)
NEUTROS ABS: 4
PLATELETS: 375 (ref 150–399)
WBC: 6.1

## 2017-10-10 LAB — HEPATIC FUNCTION PANEL
ALK PHOS: 63 (ref 25–125)
ALT: 15 (ref 7–35)
BILIRUBIN, TOTAL: 0.4

## 2017-10-10 LAB — VITAMIN B12: VITAMIN B 12: 737

## 2017-10-10 LAB — IRON,TIBC AND FERRITIN PANEL
Ferritin: 7.2
IRON: 28

## 2017-10-10 LAB — HEMOGLOBIN A1C: Hemoglobin A1C: 5.2

## 2017-10-30 ENCOUNTER — Other Ambulatory Visit: Payer: Self-pay | Admitting: Family Medicine

## 2017-10-30 NOTE — Telephone Encounter (Signed)
Copied from [place] [phone]. Topic: Quick Communication - Rx Refill/Question >> Oct 30, 2017  2:03 PM Yvette Rack wrote: Medication: valACYclovir (VALTREX) 500 MG tablet   Has the patient contacted their pharmacy? Yes.     (Agent: If no, request that the patient contact the pharmacy for the refill.)   Preferred Pharmacy (with phone number or street name): [facility] [address], [place] - [place] [phone] (Phone) [phone] (Fax)     Agent: Please be advised that RX refills may take up to 3 business days. We ask that you follow-up with your pharmacy.

## 2017-10-31 NOTE — Telephone Encounter (Signed)
LOV 08/07/16 Dr. Vonzell Schlatter [place] [address]

## 2018-03-31 ENCOUNTER — Encounter: Payer: Self-pay | Admitting: Nurse Practitioner

## 2018-03-31 ENCOUNTER — Ambulatory Visit: Payer: BC Managed Care – PPO | Admitting: Nurse Practitioner

## 2018-03-31 VITALS — BP 120/72 | HR 68 | Temp 97.6°F | Ht 63.5 in | Wt 196.8 lb

## 2018-03-31 DIAGNOSIS — A6004 Herpesviral vulvovaginitis: Secondary | ICD-10-CM

## 2018-03-31 MED ORDER — VALACYCLOVIR HCL 500 MG PO TABS: ORAL_TABLET | ORAL | 1 refills | Status: DC

## 2018-03-31 NOTE — Patient Instructions (Signed)
Valacyclovir caplets What is this medicine? VALACYCLOVIR (val ay SYE kloe veer) is an antiviral medicine. It is used to treat or prevent infections caused by certain kinds of viruses. Examples of these infections include herpes and shingles. This medicine will not cure herpes. This medicine may be used for other purposes; ask your health care provider or pharmacist if you have questions. COMMON BRAND NAME(S): Valtrex What should I tell my health care provider before I take this medicine? They need to know if you have any of these conditions: -acquired immunodeficiency syndrome (AIDS) -any other condition that may weaken the immune system -bone marrow or kidney transplant -kidney disease -an unusual or allergic reaction to valacyclovir, acyclovir, ganciclovir, valganciclovir, other medicines, foods, dyes, or preservatives -pregnant or trying to get pregnant -breast-feeding How should I use this medicine? Take this medicine by mouth with a glass of water. Follow the directions on the prescription label. You can take this medicine with or without food. Take your doses at regular intervals. Do not take your medicine more often than directed. Finish the full course prescribed by your doctor or health care professional even if you think your condition is better. Do not stop taking except on the advice of your doctor or health care professional. Talk to your pediatrician regarding the use of this medicine in children. While this drug may be prescribed for children as young as 2 years for selected conditions, precautions do apply. Overdosage: If you think you have taken too much of this medicine contact a poison control center or emergency room at once. NOTE: This medicine is only for you. Do not share this medicine with others. What if I miss a dose? If you miss a dose, take it as soon as you can. If it is almost time for your next dose, take only that dose. Do not take double or extra doses. What may  interact with this medicine? -cimetidine -probenecid This list may not describe all possible interactions. Give your health care provider a list of all the medicines, herbs, non-prescription drugs, or dietary supplements you use. Also tell them if you smoke, drink alcohol, or use illegal drugs. Some items may interact with your medicine. What should I watch for while using this medicine? Tell your doctor or health care professional if your symptoms do not start to get better after 1 week. This medicine works best when taken early in the course of an infection, within the first 59 hours. Begin treatment as soon as possible after the first signs of infection like tingling, itching, or pain in the affected area. It is possible that genital herpes may still be spread even when you are not having symptoms. Always use safer sex practices like condoms made of latex or polyurethane whenever you have sexual contact. You should stay well hydrated while taking this medicine. Drink plenty of fluids. What side effects may I notice from receiving this medicine? Side effects that you should report to your doctor or health care professional as soon as possible: -allergic reactions like skin rash, itching or hives, swelling of the face, lips, or tongue -aggressive behavior -confusion -hallucinations -problems with balance, talking, walking -stomach pain -tremor -trouble passing urine or change in the amount of urine Side effects that usually do not require medical attention (report to your doctor or health care professional if they continue or are bothersome): -dizziness -headache -nausea, vomiting This list may not describe all possible side effects. Call your doctor for medical advice about side effects. You  may report side effects to FDA at 1-800-FDA-1088. Where should I keep my medicine? Keep out of the reach of children. Store at room temperature between 15 and 25 degrees C (59 and 77 degrees F). Keep  container tightly closed. Throw away any unused medicine after the expiration date. NOTE: This sheet is a summary. It may not cover all possible information. If you have questions about this medicine, talk to your doctor, pharmacist, or health care provider.  2018 Elsevier/Gold Standard (2012-07-22 16:34:05)

## 2018-03-31 NOTE — Progress Notes (Signed)
Subjective:  Patient ID: Bailey Harmon, female    DOB: 1970-09-03  Age: 47 y.o. MRN: [id number]  CC: Medication Refill (pt would like refill on her Valtrex, pt would like it for suppression , no recent outbreak last 2005 )  HPI  Ms. Bailey Harmon is here for valtrex refill, last prescription expired. [place] of medication prn for genital outbreaks. Last outbreak 2005. Reviewed labs last done 10/2016 by [facility]: stable renal and hepatic function  Reviewed past Medical, Social and Family history today.  Outpatient Medications Prior to Visit  Medication Sig Dispense Refill  . Biotin 1 MG CAPS Take by mouth.    . Lorcaserin HCl ER 20 MG TB24 Take by mouth.    . Multiple Vitamin (MULTIVITAMIN) capsule Take by mouth.    . norethindrone (AYGESTIN) 5 MG tablet norethindrone acetate 5 mg tablet    . valACYclovir (VALTREX) 500 MG tablet TAKE ONE TABLET BY MOUTH ONCE DAILY AS NEEDED 30 tablet 0  . cholecalciferol (VITAMIN D) 1000 units tablet Take 1,000 Units by mouth daily.    . ferrous sulfate 325 (65 FE) MG tablet Take 1 tablet (325 mg total) by mouth 2 (two) times daily with a meal. (Patient not taking: Reported on 03/31/2018) 60 tablet 3  . vitamin A 10000 UNIT capsule Take 10,000 Units by mouth daily.     No facility-administered medications prior to visit.     ROS See HPI  Objective:  BP 120/72 (BP Location: Right Arm, Patient Position: Sitting, Cuff Size: Normal)   Pulse 68   Temp 97.6 F (36.4 C) (Oral)   Ht 5' 3.5" (1.613 m)   Wt 196 lb 12.8 oz (89.3 kg)   LMP 01/29/2018   SpO2 98%   BMI 34.31 kg/m   BP Readings from Last 3 Encounters:  03/31/18 120/72  08/07/16 132/78  07/23/16 (!) 138/92    Wt Readings from Last 3 Encounters:  03/31/18 196 lb 12.8 oz (89.3 kg)  08/07/16 253 lb 8 oz (115 kg)  07/23/16 246 lb 9.6 oz (111.9 kg)    Physical Exam  Constitutional: She appears well-developed and well-nourished.  Cardiovascular: Normal rate.  Pulmonary/Chest: Effort normal.    Psychiatric: She has a normal mood and affect. Her behavior is normal.  Vitals reviewed.   Lab Results  Component Value Date   WBC 8.4 08/06/2016   HGB 6.9 cL (LL) 08/06/2016   HCT 23.6 aL (LL) 08/06/2016   PLT 569.0 (H) 08/06/2016   GLUCOSE 84 08/06/2016   CHOL 138 08/06/2016   TRIG 78.0 08/06/2016   HDL 46.40 08/06/2016   LDLCALC 76 08/06/2016   ALT 20 08/06/2016   AST 26 08/06/2016   NA 135 08/06/2016   K 4.5 08/06/2016   CL 104 08/06/2016   CREATININE 0.60 08/06/2016   BUN 9 08/06/2016   CO2 26 08/06/2016   TSH 2.73 08/06/2016    Mm Screening Breast Tomo Bilateral  Result Date: 03/20/2017 CLINICAL DATA:  Screening. EXAM: 2D DIGITAL SCREENING BILATERAL MAMMOGRAM WITH CAD AND ADJUNCT TOMO COMPARISON:  Previous exam(s). ACR Breast Density Category b: There are scattered areas of fibroglandular density. FINDINGS: There are no findings suspicious for malignancy. Images were processed with CAD. IMPRESSION: No mammographic evidence of malignancy. A result letter of this screening mammogram will be mailed directly to the patient. RECOMMENDATION: Screening mammogram in one year. (Code:SM-B-01Y) BI-RADS CATEGORY  1: Negative. Electronically Signed   By: Lajean Manes M.D.   On: 03/20/2017 10:59    Assessment &  Plan:   Artelia was seen today for medication refill.  Diagnoses and all orders for this visit:  Herpes simplex vulvovaginitis -     valACYclovir (VALTREX) 500 MG tablet; Every 12hrs x3days during outbreak   I have changed Bailey Harmon's valACYclovir. I am also having her maintain her ferrous sulfate, cholecalciferol, vitamin A, multivitamin, Lorcaserin HCl ER, Biotin, and norethindrone.  Meds ordered this encounter  Medications  . valACYclovir (VALTREX) 500 MG tablet    Sig: Every 12hrs x3days during outbreak    Dispense:  30 tablet    Refill:  1    Need Office visit with PCP for additional refills    Order Specific Question:   Supervising Provider    Answer:    Zigmund Daniel, CODY [[id number]]    Follow-up: Return in about 4 weeks (around 04/28/2018) for CPE with Dr. Deborra Medina.  Wilfred Lacy, NP

## 2018-04-08 ENCOUNTER — Encounter: Payer: Self-pay | Admitting: Family Medicine

## 2018-04-08 NOTE — Progress Notes (Signed)
[facility]/thx dmf 

## 2018-04-17 ENCOUNTER — Other Ambulatory Visit: Payer: Self-pay | Admitting: Family Medicine

## 2018-04-17 DIAGNOSIS — Z1231 Encounter for screening mammogram for malignant neoplasm of breast: Secondary | ICD-10-CM

## 2018-05-19 ENCOUNTER — Ambulatory Visit
Admission: RE | Admit: 2018-05-19 | Discharge: 2018-05-19 | Disposition: A | Discharge status: Home / Self Care | Payer: BC Managed Care – PPO | Source: Ambulatory Visit | Attending: Family Medicine | Admitting: Family Medicine

## 2018-05-19 DIAGNOSIS — Z1231 Encounter for screening mammogram for malignant neoplasm of breast: Secondary | ICD-10-CM

## 2019-05-06 ENCOUNTER — Other Ambulatory Visit: Payer: Self-pay | Admitting: Family Medicine

## 2019-05-06 DIAGNOSIS — Z1231 Encounter for screening mammogram for malignant neoplasm of breast: Secondary | ICD-10-CM

## 2019-06-19 ENCOUNTER — Other Ambulatory Visit: Payer: Self-pay

## 2019-06-19 ENCOUNTER — Ambulatory Visit
Admission: RE | Admit: 2019-06-19 | Discharge: 2019-06-19 | Disposition: A | Discharge status: Home / Self Care | Payer: BC Managed Care – PPO | Source: Ambulatory Visit | Attending: Family Medicine | Admitting: Family Medicine

## 2019-06-19 DIAGNOSIS — Z1231 Encounter for screening mammogram for malignant neoplasm of breast: Secondary | ICD-10-CM

## 2019-06-24 ENCOUNTER — Other Ambulatory Visit: Payer: Self-pay | Admitting: Family Medicine

## 2019-06-24 DIAGNOSIS — R928 Other abnormal and inconclusive findings on diagnostic imaging of breast: Secondary | ICD-10-CM

## 2019-06-26 ENCOUNTER — Ambulatory Visit
Admission: RE | Admit: 2019-06-26 | Discharge: 2019-06-26 | Disposition: A | Discharge status: Home / Self Care | Payer: BC Managed Care – PPO | Source: Ambulatory Visit | Attending: Family Medicine | Admitting: Family Medicine

## 2019-06-26 ENCOUNTER — Ambulatory Visit: Payer: BC Managed Care – PPO

## 2019-06-26 ENCOUNTER — Other Ambulatory Visit: Payer: Self-pay

## 2019-06-26 DIAGNOSIS — R928 Other abnormal and inconclusive findings on diagnostic imaging of breast: Secondary | ICD-10-CM

## 2019-10-24 ENCOUNTER — Other Ambulatory Visit: Payer: Self-pay

## 2019-10-24 ENCOUNTER — Ambulatory Visit: Payer: BC Managed Care – PPO | Attending: Internal Medicine

## 2019-10-24 DIAGNOSIS — Z23 Encounter for immunization: Secondary | ICD-10-CM | POA: Insufficient documentation

## 2019-10-24 NOTE — Progress Notes (Signed)
   Covid-19 Vaccination Clinic  Name:  Bailey Harmon    MRN: [id number] DOB: 1971-08-13  10/24/2019  [name] was observed post Covid-19 immunization for 15 minutes without incident. She was provided with Vaccine Information Sheet and instruction to access the V-Safe system.   [name] was instructed to call 911 with any severe reactions post vaccine: Marland Kitchen Difficulty breathing  . Swelling of face and throat  . A fast heartbeat  . A bad rash all over body  . Dizziness and weakness   Immunizations Administered    Name Date Dose VIS Date Route   Moderna COVID-19 Vaccine 10/24/2019  4:21 PM 0.5 mL 07/21/2019 Intramuscular   Manufacturer: Moderna   Lot: [id number]   [place][id number]

## 2019-11-21 ENCOUNTER — Ambulatory Visit: Payer: BC Managed Care – PPO | Attending: Internal Medicine

## 2019-11-21 DIAGNOSIS — Z23 Encounter for immunization: Secondary | ICD-10-CM

## 2019-11-21 NOTE — Progress Notes (Signed)
   Covid-19 Vaccination Clinic  Name:  Bailey Harmon    MRN: [id number] DOB: 12-Dec-1970  11/21/2019  Ms. Alvelo was observed post Covid-19 immunization for 15 minutes without incident. She was provided with Vaccine Information Sheet and instruction to access the V-Safe system.   Ms. Vars was instructed to call 911 with any severe reactions post vaccine: Marland Kitchen Difficulty breathing  . Swelling of face and throat  . A fast heartbeat  . A bad rash all over body  . Dizziness and weakness   Immunizations Administered    Name Date Dose VIS Date Route   Moderna COVID-19 Vaccine 11/21/2019 11:28 AM 0.5 mL 07/21/2019 Intramuscular   Manufacturer: Levan Hurst   [id number]   [id number][id number]

## 2020-06-09 ENCOUNTER — Other Ambulatory Visit: Payer: Self-pay | Admitting: Family Medicine

## 2020-06-09 DIAGNOSIS — Z1231 Encounter for screening mammogram for malignant neoplasm of breast: Secondary | ICD-10-CM

## 2020-07-06 ENCOUNTER — Other Ambulatory Visit: Payer: Self-pay

## 2020-07-06 ENCOUNTER — Ambulatory Visit
Admission: RE | Admit: 2020-07-06 | Discharge: 2020-07-06 | Disposition: A | Discharge status: Home / Self Care | Payer: BC Managed Care – PPO | Source: Ambulatory Visit | Attending: Family Medicine | Admitting: Family Medicine

## 2020-07-06 DIAGNOSIS — Z1231 Encounter for screening mammogram for malignant neoplasm of breast: Secondary | ICD-10-CM

## 2020-10-12 ENCOUNTER — Other Ambulatory Visit: Payer: Self-pay

## 2020-10-14 ENCOUNTER — Encounter: Payer: BC Managed Care – PPO | Admitting: Family Medicine

## 2020-10-20 ENCOUNTER — Other Ambulatory Visit: Payer: Self-pay

## 2020-10-20 ENCOUNTER — Ambulatory Visit (INDEPENDENT_AMBULATORY_CARE_PROVIDER_SITE_OTHER): Payer: BC Managed Care – PPO | Admitting: Family Medicine

## 2020-10-20 ENCOUNTER — Encounter: Payer: Self-pay | Admitting: Family Medicine

## 2020-10-20 VITALS — BP 128/84 | HR 71 | Temp 97.5°F | Ht 64.0 in | Wt 223.4 lb

## 2020-10-20 DIAGNOSIS — Z0000 Encounter for general adult medical examination without abnormal findings: Secondary | ICD-10-CM | POA: Diagnosis not present

## 2020-10-20 DIAGNOSIS — Z1321 Encounter for screening for nutritional disorder: Secondary | ICD-10-CM | POA: Diagnosis not present

## 2020-10-20 DIAGNOSIS — Z1211 Encounter for screening for malignant neoplasm of colon: Secondary | ICD-10-CM

## 2020-10-20 DIAGNOSIS — I10 Essential (primary) hypertension: Secondary | ICD-10-CM

## 2020-10-20 DIAGNOSIS — Z13220 Encounter for screening for lipoid disorders: Secondary | ICD-10-CM

## 2020-10-20 DIAGNOSIS — A6004 Herpesviral vulvovaginitis: Secondary | ICD-10-CM

## 2020-10-20 DIAGNOSIS — Z2821 Immunization not carried out because of patient refusal: Secondary | ICD-10-CM

## 2020-10-20 LAB — CBC
HCT: 35.5 % — ABNORMAL LOW (ref 36.0–46.0)
Hemoglobin: 11.3 g/dL — ABNORMAL LOW (ref 12.0–15.0)
MCHC: 31.8 g/dL (ref 30.0–36.0)
MCV: 79.5 fl (ref 78.0–100.0)
Platelets: 362 10*3/uL (ref 150.0–400.0)
RBC: 4.47 Mil/uL (ref 3.87–5.11)
RDW: 16.8 % — ABNORMAL HIGH (ref 11.5–15.5)
WBC: 6 10*3/uL (ref 4.0–10.5)

## 2020-10-20 LAB — LIPID PANEL
Cholesterol: 191 mg/dL (ref 0–200)
HDL: 71.6 mg/dL (ref >39.00)
LDL Cholesterol: 111 mg/dL — ABNORMAL HIGH (ref 0–99)
NonHDL: 119.4
Total CHOL/HDL Ratio: 3
Triglycerides: 40 mg/dL (ref 0.0–149.0)
VLDL: 8 mg/dL (ref 0.0–40.0)

## 2020-10-20 LAB — BASIC METABOLIC PANEL
BUN: 15 mg/dL (ref 6–23)
CO2: 30 mEq/L (ref 19–32)
Calcium: 9.5 mg/dL (ref 8.4–10.5)
Chloride: 106 mEq/L (ref 96–112)
Creatinine, Ser: 0.57 mg/dL (ref 0.40–1.20)
GFR: 105.85 mL/min (ref >60.00)
Glucose, Bld: 82 mg/dL (ref 70–99)
Potassium: 4.8 mEq/L (ref 3.5–5.1)
Sodium: 139 mEq/L (ref 135–145)

## 2020-10-20 LAB — AST: AST: 17 U/L (ref 0–37)

## 2020-10-20 LAB — VITAMIN D 25 HYDROXY (VIT D DEFICIENCY, FRACTURES): VITD: 27.43 ng/mL — ABNORMAL LOW (ref 30.00–100.00)

## 2020-10-20 LAB — ALT: ALT: 11 U/L (ref 0–35)

## 2020-10-20 MED ORDER — VALACYCLOVIR HCL 500 MG PO TABS: ORAL_TABLET | ORAL | 1 refills | Status: AC

## 2020-10-20 NOTE — Progress Notes (Signed)
Bailey Harmon is a 50 y.o. female  Chief Complaint  Patient presents with  . Establish Care    TOC- CPE/labs.  No concerns.  Declines flu shot today. Needs refills on the Valtrex.    HPI: Bailey Harmon is a 50 y.o. female seen today for annual CPE, fasting labs.   Last PAP: "been a while"; no h/o abnormal PAP Last mammo: 06/2020 Last colonoscopy: never  Dental: UTD Vision: UTD Diet: too much sugar and too many carbs Exercise: walks daily, goes to gym a few days per week  Med refills needed today? Valacyclovir - rare outbreak, not in years   Past Medical History:  Diagnosis Date  . Chicken pox   . GERD (gastroesophageal reflux disease)   . Hypertension     Past Surgical History:  Procedure Laterality Date  . CHOLECYSTECTOMY    . TUBAL LIGATION  1994    Social History   Socioeconomic History  . Marital status: Married    Spouse name: Not on file  . Number of children: Not on file  . Years of education: Not on file  . Highest education level: Not on file  Occupational History  . Not on file  Tobacco Use  . Smoking status: Never Smoker  . Smokeless tobacco: Never Used  Vaping Use  . Vaping Use: Never used  Substance and Sexual Activity  . Alcohol use: No    Alcohol/week: 0.0 standard drinks  . Drug use: No  . Sexual activity: Yes    Partners: Male    Birth control/protection: None  Other Topics Concern  . Not on file  Social History Narrative  . Not on file   Social Determinants of Health   Financial Resource Strain: Not on file  Food Insecurity: Not on file  Transportation Needs: Not on file  Physical Activity: Not on file  Stress: Not on file  Social Connections: Not on file  Intimate Partner Violence: Not on file    Family History  Problem Relation Age of Onset  . Hypertension Mother   . Cancer Father   . Heart disease Maternal Grandmother   . Hypertension Maternal Grandmother   . Diabetes Maternal Grandmother      Immunization  History  Administered Date(s) Administered  . Moderna Sars-Covid-2 Vaccination 10/24/2019, 11/21/2019  . Tdap 06/07/2016    Outpatient Encounter Medications as of 10/20/2020  Medication Sig  . Biotin 1 MG CAPS Take by mouth.  . cholecalciferol (VITAMIN D) 1000 units tablet Take 1,000 Units by mouth daily.  . Multiple Vitamin (MULTIVITAMIN) capsule Take by mouth.  . valACYclovir (VALTREX) 500 MG tablet Every 12hrs x3days during outbreak  . ferrous sulfate 325 (65 FE) MG tablet Take 1 tablet (325 mg total) by mouth 2 (two) times daily with a meal. (Patient not taking: No sig reported)  . Lorcaserin HCl ER 20 MG TB24 Take by mouth. (Patient not taking: Reported on 10/20/2020)  . norethindrone (AYGESTIN) 5 MG tablet norethindrone acetate 5 mg tablet (Patient not taking: Reported on 10/20/2020)  . vitamin A 10000 UNIT capsule Take 10,000 Units by mouth daily. (Patient not taking: Reported on 10/20/2020)   No facility-administered encounter medications on file as of 10/20/2020.     ROS: Gen: no fever, chills  Skin: no rash, itching ENT: no ear pain, ear drainage, nasal congestion, rhinorrhea, sinus pressure, sore throat Eyes: no blurry vision, double vision Resp: no cough, wheeze,SOB Breast: no breast tenderness, no nipple discharge, no breast masses CV: no CP,  palpitations, LE edema,  GI: no heartburn, n/v/d/c, abd pain GU: no dysuria, urgency, frequency, hematuria MSK: no joint pain, myalgias, back pain Neuro: no dizziness, headache, weakness, vertigo Psych: no depression, anxiety, insomnia   No Known Allergies  BP 128/84   Pulse 71   Temp (!) 97.5 F (36.4 C) (Temporal)   Ht 5\' 4"  (1.626 m)   Wt 223 lb 6.4 oz (101.3 kg)   SpO2 98%   BMI 38.35 kg/m   Wt Readings from Last 3 Encounters:  10/20/20 223 lb 6.4 oz (101.3 kg)  03/31/18 196 lb 12.8 oz (89.3 kg)  08/07/16 253 lb 8 oz (115 kg)   Temp Readings from Last 3 Encounters:  10/20/20 (!) 97.5 F (36.4 C) (Temporal)  03/31/18  97.6 F (36.4 C) (Oral)  08/07/16 98.2 F (36.8 C) (Oral)   BP Readings from Last 3 Encounters:  10/20/20 128/84  03/31/18 120/72  08/07/16 132/78   Pulse Readings from Last 3 Encounters:  10/20/20 71  03/31/18 68  08/07/16 96     Physical Exam Constitutional:      General: She is not in acute distress.    Appearance: She is well-developed and well-nourished.  HENT:     Head: Normocephalic and atraumatic.     Right Ear: Tympanic membrane and ear canal normal.     Left Ear: Tympanic membrane and ear canal normal.     Nose: Nose normal.     Mouth/Throat:     Mouth: Oropharynx is clear and moist and mucous membranes are normal. Mucous membranes are moist.     Pharynx: Oropharynx is clear.  Eyes:     Conjunctiva/sclera: Conjunctivae normal.  Neck:     Thyroid: No thyromegaly.  Cardiovascular:     Rate and Rhythm: Normal rate and regular rhythm.     Pulses: Intact distal pulses.     Heart sounds: Normal heart sounds. No murmur heard.   Pulmonary:     Effort: Pulmonary effort is normal. No respiratory distress.     Breath sounds: Normal breath sounds. No wheezing or rhonchi.  Abdominal:     General: Bowel sounds are normal. There is no distension.     Palpations: Abdomen is soft. There is no mass.     Tenderness: There is no abdominal tenderness.  Musculoskeletal:        General: No edema.     Cervical back: Neck supple.     Right lower leg: No edema.     Left lower leg: No edema.  Lymphadenopathy:     Cervical: No cervical adenopathy.  Skin:    General: Skin is warm and dry.  Neurological:     Mental Status: She is alert and oriented to person, place, and time.     Motor: No abnormal muscle tone.     Coordination: Coordination normal.  Psychiatric:        Mood and Affect: Mood and affect normal.        Behavior: Behavior normal.      A/P:  1. Annual physical exam - discussed importance of regular CV exercise, healthy diet, adequate sleep - due for PAP  and can schedule with our office - colonoscopy referral placed - mammo UTD - UTD on dental and vision exams - ALT - AST - Basic metabolic panel - CBC - next CPE in 1 year  2. Primary hypertension - not on medication  - Basic metabolic panel  3. Screening for lipid disorders - Lipid panel  4. Encounter for vitamin deficiency screening - takes 1000IU daily - VITAMIN D 25 Hydroxy (Vit-D Deficiency, Fractures)  5. Influenza vaccination declined by patient  6. Screening for colon cancer - Ambulatory referral to Gastroenterology  7. Herpes simplex vulvovaginitis - very well-controlled Refill: - valACYclovir (VALTREX) 500 MG tablet; Every 12hrs x3days during outbreak  Dispense: 30 tablet; Refill: 1    This visit occurred during the SARS-CoV-2 public health emergency.  Safety protocols were in place, including screening questions prior to the visit, additional usage of staff PPE, and extensive cleaning of exam room while observing appropriate contact time as indicated for disinfecting solutions.

## 2020-10-21 ENCOUNTER — Encounter: Payer: Self-pay | Admitting: Family Medicine

## 2020-11-07 ENCOUNTER — Encounter: Payer: Self-pay | Admitting: Family Medicine

## 2021-05-11 ENCOUNTER — Other Ambulatory Visit: Payer: Self-pay | Admitting: Family Medicine

## 2021-05-11 DIAGNOSIS — Z1231 Encounter for screening mammogram for malignant neoplasm of breast: Secondary | ICD-10-CM

## 2021-07-07 ENCOUNTER — Ambulatory Visit: Payer: BC Managed Care – PPO

## 2021-07-07 ENCOUNTER — Other Ambulatory Visit: Payer: Self-pay

## 2021-07-07 ENCOUNTER — Ambulatory Visit
Admission: RE | Admit: 2021-07-07 | Discharge: 2021-07-07 | Disposition: A | Discharge status: Home / Self Care | Payer: BC Managed Care – PPO | Source: Ambulatory Visit | Attending: Pediatrics | Admitting: Pediatrics

## 2021-07-07 DIAGNOSIS — Z1231 Encounter for screening mammogram for malignant neoplasm of breast: Secondary | ICD-10-CM

## 2021-07-08 ENCOUNTER — Ambulatory Visit: Payer: BC Managed Care – PPO

## 2022-06-28 ENCOUNTER — Other Ambulatory Visit: Payer: Self-pay | Admitting: Obstetrics and Gynecology

## 2022-06-28 DIAGNOSIS — Z1231 Encounter for screening mammogram for malignant neoplasm of breast: Secondary | ICD-10-CM

## 2022-07-19 ENCOUNTER — Ambulatory Visit
Admission: RE | Admit: 2022-07-19 | Discharge: 2022-07-19 | Disposition: A | Discharge status: Home / Self Care | Payer: BC Managed Care – PPO | Source: Ambulatory Visit | Attending: Obstetrics and Gynecology | Admitting: Obstetrics and Gynecology

## 2022-07-19 DIAGNOSIS — Z1231 Encounter for screening mammogram for malignant neoplasm of breast: Secondary | ICD-10-CM

## 2022-07-20 ENCOUNTER — Ambulatory Visit: Payer: BC Managed Care – PPO

## 2023-07-09 ENCOUNTER — Other Ambulatory Visit: Payer: Self-pay | Admitting: Obstetrics and Gynecology

## 2023-07-09 DIAGNOSIS — Z1231 Encounter for screening mammogram for malignant neoplasm of breast: Secondary | ICD-10-CM

## 2023-08-07 ENCOUNTER — Ambulatory Visit
Admission: RE | Admit: 2023-08-07 | Discharge: 2023-08-07 | Disposition: A | Discharge status: Home / Self Care | Payer: BC Managed Care – PPO | Source: Ambulatory Visit | Attending: Obstetrics and Gynecology | Admitting: Obstetrics and Gynecology

## 2023-08-07 DIAGNOSIS — Z1231 Encounter for screening mammogram for malignant neoplasm of breast: Secondary | ICD-10-CM

## 2023-11-16 IMAGING — MG MM DIGITAL SCREENING BILAT W/ TOMO AND CAD
8 series · 8 of 24 positions shown · non-contrast
Comparison: Previous exam(s).

CLINICAL DATA: Screening.

EXAM:
DIGITAL SCREENING BILATERAL MAMMOGRAM WITH TOMOSYNTHESIS AND CAD
TECHNIQUE: Bilateral screening digital craniocaudal and mediolateral oblique
mammograms were obtained. Bilateral screening digital breast
tomosynthesis was performed. The images were evaluated with
computer-aided detection.

[L CC synth-2D]
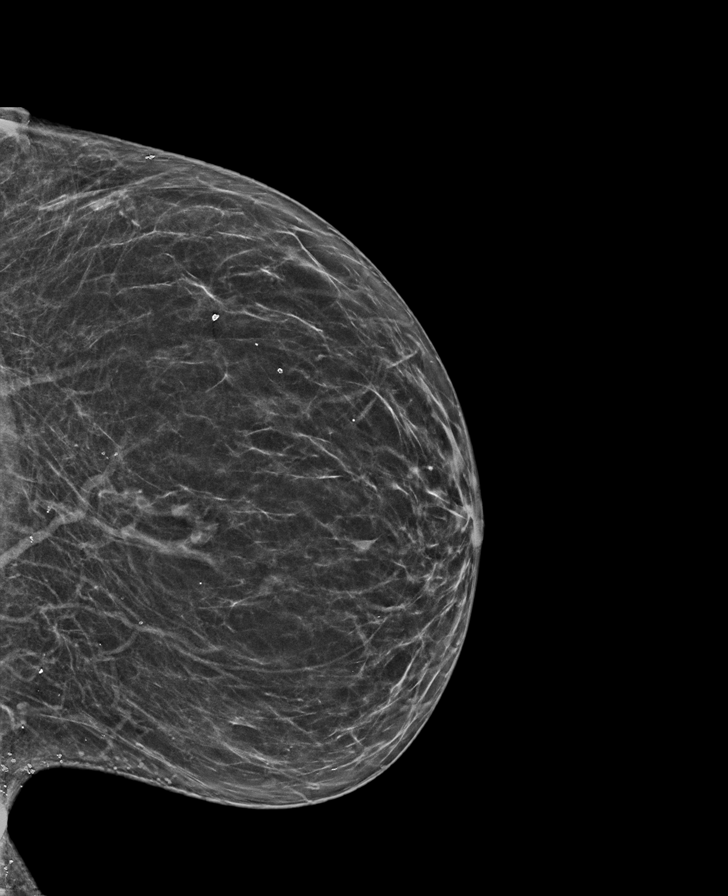

[R CC synth-2D]
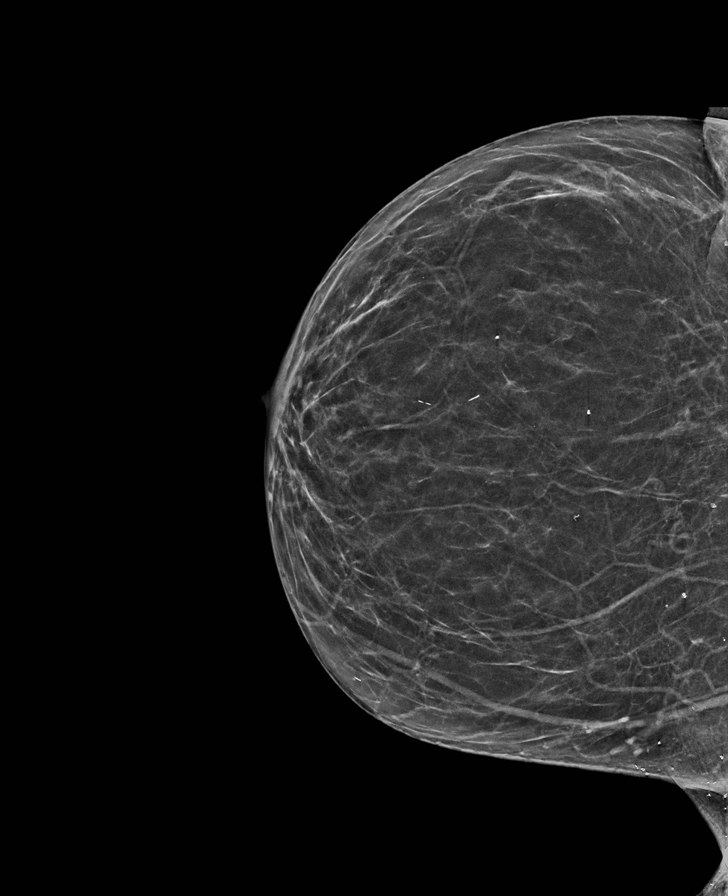

[L MLO synth-2D]
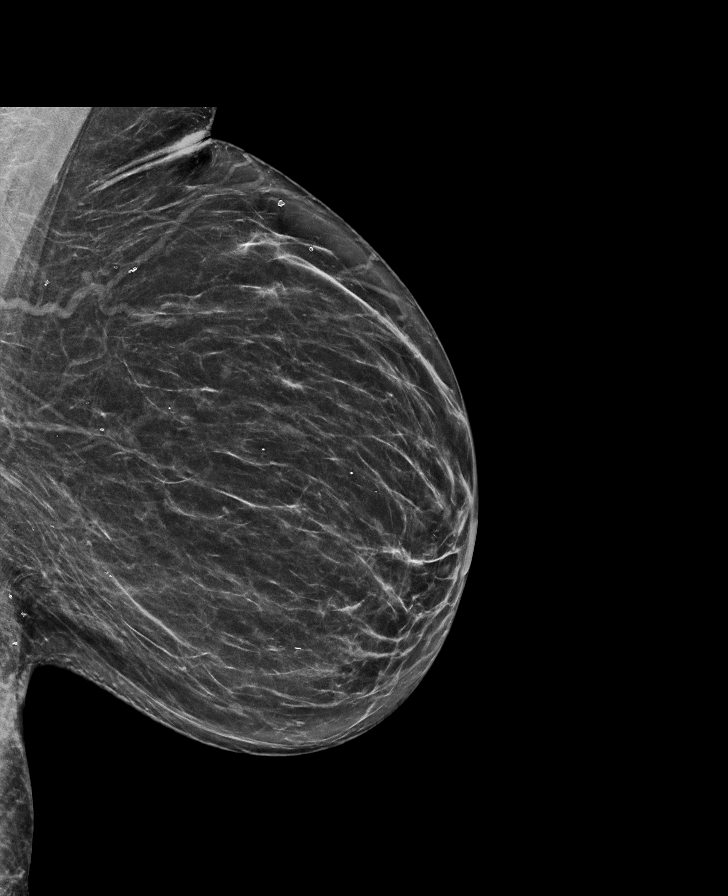

[R MLO synth-2D]
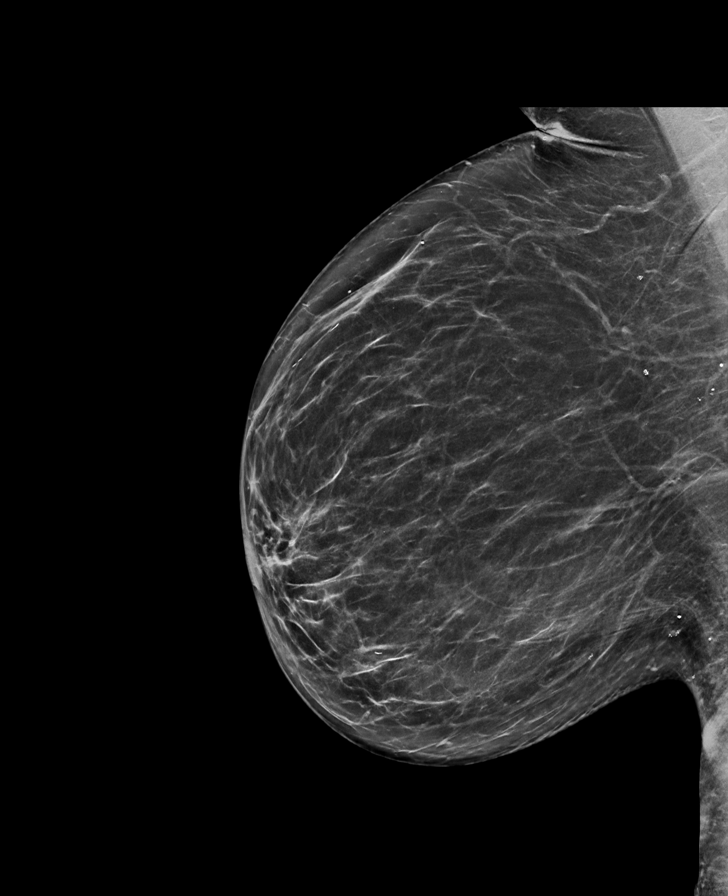

[L MLO tomo · tomo slice 35/70.0]
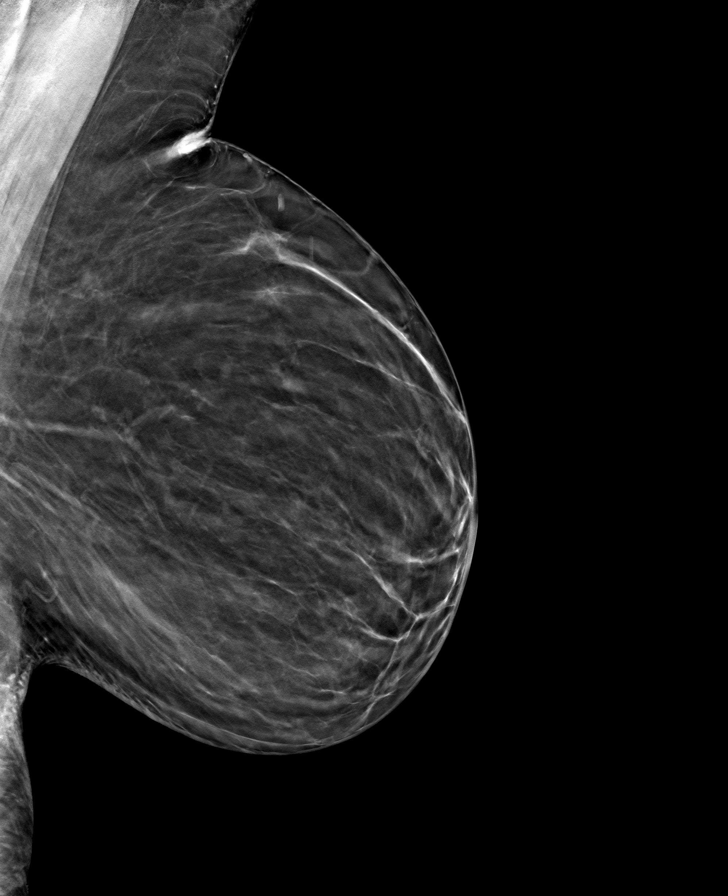

[R CC tomo · tomo slice 31/61.0]
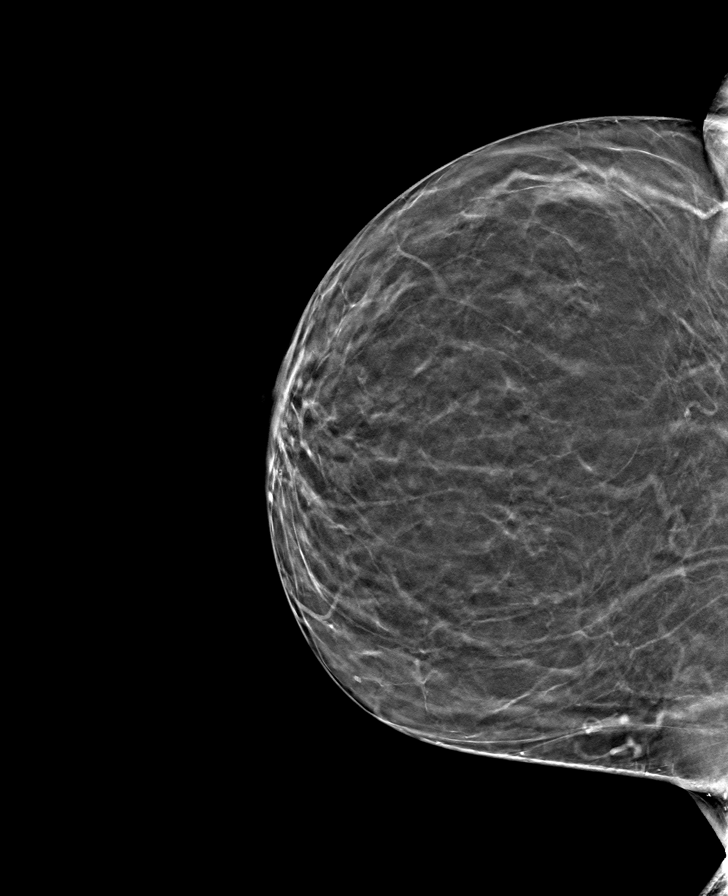

[R MLO tomo · tomo slice 37/73.0]
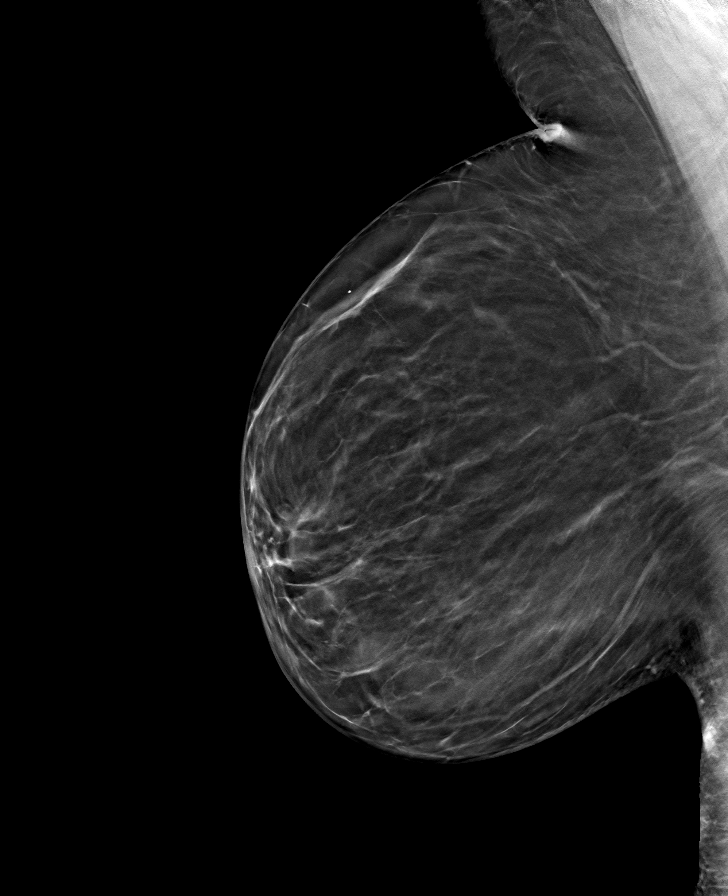

[L CC tomo · tomo slice 33/64.0]
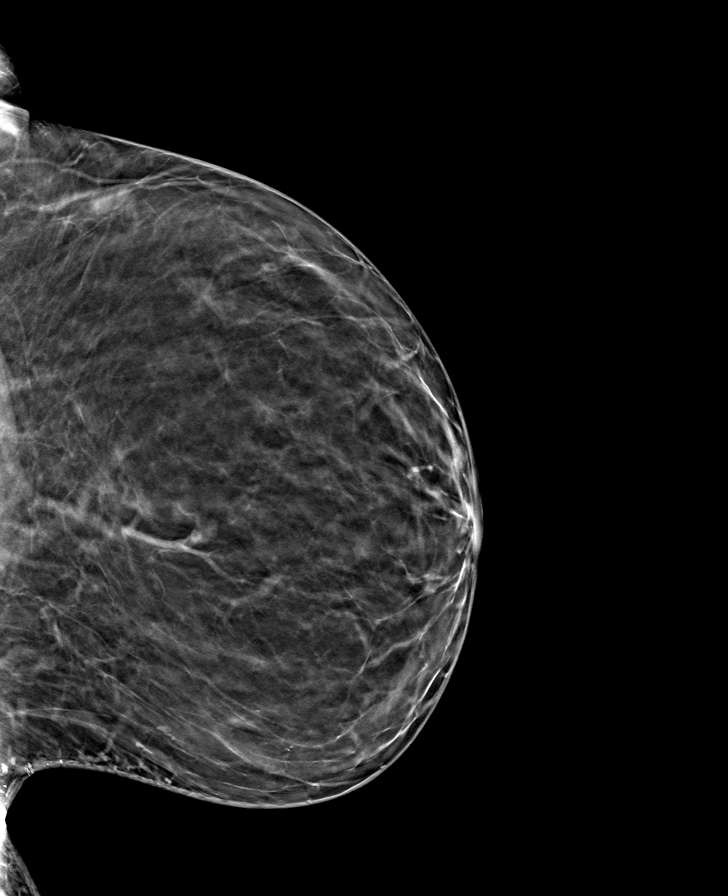

[8 of 24 positions shown; findings below may reference images not displayed]

ACR Breast Density Category b: There are scattered areas of
fibroglandular density.
FINDINGS: There are no findings suspicious for malignancy.
IMPRESSION: No mammographic evidence of malignancy. A result letter of this
screening mammogram will be mailed directly to the patient.

RECOMMENDATION:
Screening mammogram in one year. (Code:51-O-LD2)

BI-RADS CATEGORY  1: Negative.

## 2024-07-15 ENCOUNTER — Other Ambulatory Visit: Payer: Self-pay | Admitting: Obstetrics and Gynecology

## 2024-07-15 ENCOUNTER — Other Ambulatory Visit: Payer: Self-pay | Admitting: Mental Health

## 2024-07-15 DIAGNOSIS — Z1231 Encounter for screening mammogram for malignant neoplasm of breast: Secondary | ICD-10-CM

## 2024-08-14 ENCOUNTER — Ambulatory Visit
Admission: RE | Admit: 2024-08-14 | Discharge: 2024-08-14 | Disposition: A | Discharge status: Home / Self Care | Source: Ambulatory Visit | Attending: Obstetrics and Gynecology | Admitting: Obstetrics and Gynecology

## 2024-08-14 DIAGNOSIS — Z1231 Encounter for screening mammogram for malignant neoplasm of breast: Secondary | ICD-10-CM
